# Patient Record
Sex: Male | Born: 1950 | Race: White | Hispanic: No | Smoking: Former smoker
Health system: Southern US, Community
[De-identification: ages and names within clinical notes are randomized; demographics above are authoritative.]

## PROBLEM LIST (undated history)

## (undated) DIAGNOSIS — R600 Localized edema: Secondary | ICD-10-CM

## (undated) DIAGNOSIS — F0390 Unspecified dementia without behavioral disturbance: Secondary | ICD-10-CM

## (undated) DIAGNOSIS — N289 Disorder of kidney and ureter, unspecified: Secondary | ICD-10-CM

## (undated) DIAGNOSIS — I252 Old myocardial infarction: Secondary | ICD-10-CM

## (undated) DIAGNOSIS — I10 Essential (primary) hypertension: Secondary | ICD-10-CM

## (undated) DIAGNOSIS — E119 Type 2 diabetes mellitus without complications: Secondary | ICD-10-CM

## (undated) DIAGNOSIS — N050 Unspecified nephritic syndrome with minor glomerular abnormality: Secondary | ICD-10-CM

## (undated) DIAGNOSIS — G4730 Sleep apnea, unspecified: Secondary | ICD-10-CM

## (undated) DIAGNOSIS — E7800 Pure hypercholesterolemia, unspecified: Secondary | ICD-10-CM

## (undated) HISTORY — DX: Disorder of kidney and ureter, unspecified: N28.9

## (undated) HISTORY — DX: Type 2 diabetes mellitus without complications: E11.9

## (undated) HISTORY — DX: Localized edema: R60.0

## (undated) HISTORY — PX: CHOLECYSTECTOMY: SHX55

## (undated) HISTORY — DX: Pure hypercholesterolemia, unspecified: E78.00

## (undated) HISTORY — DX: Unspecified nephritic syndrome with minor glomerular abnormality: N05.0

## (undated) HISTORY — DX: Old myocardial infarction: I25.2

## (undated) HISTORY — PX: APPENDECTOMY: SHX54

## (undated) HISTORY — DX: Essential (primary) hypertension: I10

---

## 2017-10-22 DIAGNOSIS — I2102 ST elevation (STEMI) myocardial infarction involving left anterior descending coronary artery: Secondary | ICD-10-CM

## 2017-10-22 DIAGNOSIS — I2510 Atherosclerotic heart disease of native coronary artery without angina pectoris: Secondary | ICD-10-CM

## 2017-10-22 HISTORY — DX: ST elevation (STEMI) myocardial infarction involving left anterior descending coronary artery: I21.02

## 2017-10-22 HISTORY — DX: Atherosclerotic heart disease of native coronary artery without angina pectoris: I25.10

## 2020-04-22 DIAGNOSIS — E785 Hyperlipidemia, unspecified: Secondary | ICD-10-CM | POA: Insufficient documentation

## 2020-05-24 ENCOUNTER — Ambulatory Visit: Payer: Self-pay | Admitting: Internal Medicine

## 2020-06-14 ENCOUNTER — Encounter: Payer: Self-pay | Admitting: Internal Medicine

## 2020-06-14 ENCOUNTER — Ambulatory Visit (INDEPENDENT_AMBULATORY_CARE_PROVIDER_SITE_OTHER): Payer: Medicare Other | Admitting: Internal Medicine

## 2020-06-14 ENCOUNTER — Other Ambulatory Visit: Payer: Self-pay

## 2020-06-14 VITALS — BP 98/52 | HR 87 | Ht 71.5 in | Wt 216.6 lb

## 2020-06-14 DIAGNOSIS — I25810 Atherosclerosis of coronary artery bypass graft(s) without angina pectoris: Secondary | ICD-10-CM

## 2020-06-14 DIAGNOSIS — E782 Mixed hyperlipidemia: Secondary | ICD-10-CM

## 2020-06-14 DIAGNOSIS — N189 Chronic kidney disease, unspecified: Secondary | ICD-10-CM

## 2020-06-14 DIAGNOSIS — I10 Essential (primary) hypertension: Secondary | ICD-10-CM

## 2020-06-14 LAB — CBC
Hematocrit: 39.5 % (ref 37.5–51.0)
Hemoglobin: 13.7 g/dL (ref 13.0–17.7)
MCH: 31 pg (ref 26.6–33.0)
MCHC: 34.7 g/dL (ref 31.5–35.7)
MCV: 89 fL (ref 79–97)
Platelets: 219 10*3/uL (ref 150–450)
RBC: 4.42 x10E6/uL (ref 4.14–5.80)
RDW: 13.2 % (ref 11.6–15.4)
WBC: 9.7 10*3/uL (ref 3.4–10.8)

## 2020-06-14 LAB — BASIC METABOLIC PANEL
BUN/Creatinine Ratio: 7 — ABNORMAL LOW (ref 10–24)
BUN: 16 mg/dL (ref 8–27)
CO2: 26 mmol/L (ref 20–29)
Calcium: 9 mg/dL (ref 8.6–10.2)
Chloride: 103 mmol/L (ref 96–106)
Creatinine, Ser: 2.19 mg/dL — ABNORMAL HIGH (ref 0.76–1.27)
GFR calc Af Amer: 35 mL/min/{1.73_m2} — ABNORMAL LOW (ref >59)
GFR calc non Af Amer: 30 mL/min/{1.73_m2} — ABNORMAL LOW (ref >59)
Glucose: 179 mg/dL — ABNORMAL HIGH (ref 65–99)
Potassium: 4.6 mmol/L (ref 3.5–5.2)
Sodium: 136 mmol/L (ref 134–144)

## 2020-06-14 LAB — URINALYSIS
Bilirubin, UA: NEGATIVE
Ketones, UA: NEGATIVE
Leukocytes,UA: NEGATIVE
Nitrite, UA: NEGATIVE
Specific Gravity, UA: 1.021 (ref 1.005–1.030)
Urobilinogen, Ur: 1 mg/dL (ref 0.2–1.0)
pH, UA: 6.5 (ref 5.0–7.5)

## 2020-06-14 LAB — LIPID PANEL
Chol/HDL Ratio: 5.5 ratio — ABNORMAL HIGH (ref 0.0–5.0)
Cholesterol, Total: 232 mg/dL — ABNORMAL HIGH (ref 100–199)
HDL: 42 mg/dL (ref >39)
LDL Chol Calc (NIH): 141 mg/dL — ABNORMAL HIGH (ref 0–99)
Triglycerides: 270 mg/dL — ABNORMAL HIGH (ref 0–149)
VLDL Cholesterol Cal: 49 mg/dL — ABNORMAL HIGH (ref 5–40)

## 2020-06-14 NOTE — Patient Instructions (Signed)
Medication Instructions:  No changes *If you need a refill on your cardiac medications before your next appointment, please call your pharmacy*   Lab Work: Today: lipids, cbc, bmet, urinalysis   If you have labs (blood work) drawn today and your tests are completely normal, you will receive your results only by: Marland Kitchen MyChart Message (if you have MyChart) OR . A paper copy in the mail If you have any lab test that is abnormal or we need to change your treatment, we will call you to review the results.   Testing/Procedures: none   Follow-Up: At [facility], you and your health needs are our priority.  As part of our continuing mission to provide you with exceptional heart care, we have created designated Provider Care Teams.  These Care Teams include your primary Cardiologist (physician) and Advanced Practice Providers (APPs -  Physician Assistants and Nurse Practitioners) who all work together to provide you with the care you need, when you need it.  We recommend signing up for the patient portal called "MyChart".  Sign up information is provided on this After Visit Summary.  MyChart is used to connect with patients for Virtual Visits (Telemedicine).  Patients are able to view lab/test results, encounter notes, upcoming appointments, etc.  Non-urgent messages can be sent to your provider as well.   To learn more about what you can do with MyChart, go to [email or web address].    Your next appointment:   8 month(s) (August 2022)  The format for your next appointment:   In Person  Provider:   You may see Dr. Dietrich Pates or one of the following Advanced Practice Providers on your designated Care Team:    Tereso Newcomer, PA-C  Chelsea Aus, [place]    Other Instructions You have been referred to [facility]

## 2020-06-14 NOTE — Progress Notes (Signed)
Cardiology Office Note   Date:  06/14/2020   ID:  Anthony Morse, DOB 1951/01/16, MRN [id number]  PCP:  Anthony Brod, Morse  Cardiologist:   Anthony Pates, Morse   Pt referred for continued care of CAD    History of Present Illness: Anthony Morse is a 70 y.o. male with a history of coronary disease, diabetes, hyperlipidemia, hypertension, chronic kidney disease, GE reflux.  Patient is followed by Anthony Morse.  He was seen in December.  Referred to cardiology for further care. He was previously followed in [facility]  Cardiac Hx:   The pt  has a history of an MI i2007, s/p PTCA/Stent x 2.  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 Stress test in October 2019 showed no ischemia. Echo in 2019 LVEF 40%  He was followed in the [facility] health system.  Anthony Morse.  He was last seen in February 2021. Marland Kitchen  Last lipid panel back in January this year LDL was 120, HDL 31, triglycerides 365.  Hemoglobin A1c was 7.  The patient was seen by his primary care Anthony Morse back early part of January.  His blood pressure was low at the time and lisinopril and carvedilol were held.  He is also also off Lasix.  Since then he denies dizziness.  His blood pressure is still low.  Again the pt has moved to this area to be closer to family Since seen in clinic the patient denies chest pain.  Breathing is okay.  He says he is drinking adequately.  He would like to have a referral to [facility].   Current Meds  Medication Sig  . aspirin EC 81 MG tablet Take 81 mg by mouth daily. Swallow whole.  Marland Kitchen atorvastatin (LIPITOR) 40 MG tablet Take 40 mg by mouth daily.  . fenofibrate micronized (ANTARA) 130 MG capsule Take 130 mg by mouth daily before breakfast.  . gabapentin (NEURONTIN) 100 MG capsule Take 100 mg by mouth 3 (three) times daily.  . insulin aspart protamine- aspart (NOVOLOG MIX 70/30) (70-30) 100 UNIT/ML injection Inject into the skin.  Marland Kitchen niacin 500 MG tablet Take 500 mg by mouth at bedtime.     Allergies:   Tramadol    Past Medical History:  Diagnosis Date  . DM (diabetes mellitus) (HCC)   . Edema leg   . High cholesterol   . History of MI (myocardial infarction)   . Hypertension   . Kidney disease      Social History:  The patient  reports that he has quit smoking. He has never used smokeless tobacco.   Family History:  The patient's family history is not on file.    ROS:  Please see the history of present illness. All other systems are reviewed and  Negative to the above problem except as noted.    PHYSICAL EXAM: VS:  BP (!) 98/52   Pulse 87   Ht 5' 11.5" (1.816 m)   Wt 216 lb 9.6 oz (98.2 kg)   BMI 29.79 kg/m   GEN: Well nourished, well developed, in no acute distress  HEENT: normal  Neck: no JVD, carotid bruits, Cardiac: RRR; no murmurs.  No lower extremity edema  Respiratory:  clear to auscultation bilaterally, GI: soft, nontender, nondistended, + BS  No hepatomegaly  MS: no deformity Moving all extremities   Skin: warm and dry, no rash Neuro:  Strength and sensation are intact Psych: euthymic mood, full affect   EKG:  EKG is ordered today.  Sinus rhythm.  87 bpm.  Occasional PVC.  Right bundle branch block.   Nonspecific ST changes  Tests done at [facility]:  Echo (03/13/2018) Mildly abnormal left ventricular ejection fraction estimated at 40%. Abnormal relaxation filling pattern of the left ventricle for age (stage 1 diastolic dysfunction). The right ventricle is normal in size and function. The right atrium is normal in size. Mild to Moderate Left atrial dilatation. Mild aortic valve sclerosis.  Stress Cardiolite(02/19/2018):  A small perfusion abnormality of moderate severity in the apex region on the stress images. This defect is fixed and suggestive of a scar. No significant reversibility to suggest ischemia.    Lipid Panel No results found for: CHOL, TRIG, HDL, CHOLHDL, VLDL, LDLCALC, LDLDIRECT    Wt Readings from Last 3 Encounters:   06/14/20 216 lb 9.6 oz (98.2 kg)      ASSESSMENT AND PLAN:  1.  CAD.  Patient with a history of remote CABG.  Doing well clinically.  Volume appears OK   REcently he was taken off his medicines (carvedilol, lisinopril) because of low blood pressure.  We will check labs today.  We will also check a specific gravity to see him when she is concentrating urine.  He has a follow-up with Anthony Morse in the next few weeks who can titrate things back.  Again, as blood pressure tolerates.  I will set to see later this summer.  2 chronic CHF (systolic.  LVEF on echo was 40%.  Blood pressure is low.  I would hold other medicines.  Add back as blood pressure tolerates.  Overall volume appears okay.  I would not plan another echocardiogram at this point.  3.  Hyperlipidemia.  Last lipids from Dr. Maceo Morse office the LDL was 120.  This is very different than the [facility] labs with the LDL was 77.  We will repeat today  4.  Diabetes.  Watch carbohydrates.  Follow-up in August.  Again he will be seen by Anthony Morse again.  Also hopefully he will be seen in renal clinic.     Current medicines are reviewed at length with the patient today.  The patient does not have concerns regarding medicines.  Signed, Anthony Pates, Morse  06/14/2020 10:51 AM    [facility] [address] [place], [place], [place]  [address] Phone: [phone]; Fax: [phone]

## 2020-06-20 ENCOUNTER — Telehealth: Payer: Self-pay | Admitting: Internal Medicine

## 2020-06-20 DIAGNOSIS — E782 Mixed hyperlipidemia: Secondary | ICD-10-CM

## 2020-06-20 NOTE — Telephone Encounter (Signed)
Pt daughter called in returning Ann's call for results.  Best cb number is [phone]

## 2020-06-21 NOTE — Telephone Encounter (Signed)
Left message for patient's daughter to call back. (DPR)

## 2020-06-22 MED ORDER — ROSUVASTATIN CALCIUM 40 MG PO TABS: 40.0000 mg | ORAL_TABLET | Freq: Every day | ORAL | 3 refills | Status: AC

## 2020-06-22 NOTE — Telephone Encounter (Signed)
-----   Message from Pricilla Riffle, MD sent at 06/15/2020 12:54 PM EST ----- CBC is normal   Cri is a little over 2   Urine is concentrated    Just make sure getting adequate fluids during day   (low salt) LDL is higher than it shold be   Would he be willing to switch to 40 mg Crestor   More portent at lowering LDL  Check lipids in 2 months

## 2020-06-22 NOTE — Telephone Encounter (Signed)
Spoke with patient's daughter. Reviewed labs.  Pt drinking mostly coffee and diet sodas.  She will encourage him to add more water/calorie free drinks other than sodas daily, aiming for 50-60 ounces of water daily.  Pt was seen in renal clinic recently by Dr. Vivia Birmingham.  Will forward labs there and to PCP.  He will stop atorvastatin and begin Crestor and return for labs on 08/18/20.

## 2020-07-04 ENCOUNTER — Other Ambulatory Visit: Payer: Self-pay | Admitting: Nephrology

## 2020-07-04 DIAGNOSIS — N1832 Chronic kidney disease, stage 3b: Secondary | ICD-10-CM

## 2020-07-14 ENCOUNTER — Ambulatory Visit
Admission: RE | Admit: 2020-07-14 | Discharge: 2020-07-14 | Disposition: A | Discharge status: Home / Self Care | Payer: Medicare Other | Source: Ambulatory Visit | Attending: Nephrology | Admitting: Nephrology

## 2020-07-14 DIAGNOSIS — N1832 Chronic kidney disease, stage 3b: Secondary | ICD-10-CM

## 2020-08-15 ENCOUNTER — Other Ambulatory Visit (HOSPITAL_COMMUNITY): Payer: Self-pay | Admitting: Nephrology

## 2020-08-15 DIAGNOSIS — N1832 Chronic kidney disease, stage 3b: Secondary | ICD-10-CM

## 2020-08-18 ENCOUNTER — Other Ambulatory Visit: Payer: Medicare Other

## 2020-08-22 ENCOUNTER — Ambulatory Visit (HOSPITAL_COMMUNITY): Payer: Medicare Other

## 2020-08-22 ENCOUNTER — Other Ambulatory Visit: Payer: Self-pay | Admitting: Student

## 2020-08-22 ENCOUNTER — Other Ambulatory Visit: Payer: Self-pay | Admitting: Radiology

## 2020-08-23 ENCOUNTER — Other Ambulatory Visit: Payer: Self-pay

## 2020-08-23 ENCOUNTER — Ambulatory Visit (HOSPITAL_COMMUNITY)
Admission: RE | Admit: 2020-08-23 | Discharge: 2020-08-23 | Disposition: A | Discharge status: Home / Self Care | Payer: Medicare Other | Source: Ambulatory Visit | Attending: Nephrology | Admitting: Nephrology

## 2020-08-23 ENCOUNTER — Encounter (HOSPITAL_COMMUNITY): Payer: Self-pay

## 2020-08-23 DIAGNOSIS — N1832 Chronic kidney disease, stage 3b: Secondary | ICD-10-CM

## 2020-08-23 DIAGNOSIS — Z794 Long term (current) use of insulin: Secondary | ICD-10-CM | POA: Insufficient documentation

## 2020-08-23 DIAGNOSIS — I129 Hypertensive chronic kidney disease with stage 1 through stage 4 chronic kidney disease, or unspecified chronic kidney disease: Secondary | ICD-10-CM | POA: Diagnosis present

## 2020-08-23 DIAGNOSIS — R809 Proteinuria, unspecified: Secondary | ICD-10-CM | POA: Diagnosis not present

## 2020-08-23 DIAGNOSIS — N189 Chronic kidney disease, unspecified: Secondary | ICD-10-CM | POA: Insufficient documentation

## 2020-08-23 DIAGNOSIS — E1122 Type 2 diabetes mellitus with diabetic chronic kidney disease: Secondary | ICD-10-CM | POA: Diagnosis not present

## 2020-08-23 LAB — GLUCOSE, CAPILLARY: Glucose-Capillary: 157 mg/dL — ABNORMAL HIGH (ref 70–99)

## 2020-08-23 LAB — CBC
HCT: 41.7 % (ref 39.0–52.0)
Hemoglobin: 14.2 g/dL (ref 13.0–17.0)
MCH: 30.9 pg (ref 26.0–34.0)
MCHC: 34.1 g/dL (ref 30.0–36.0)
MCV: 90.7 fL (ref 80.0–100.0)
Platelets: 304 10*3/uL (ref 150–400)
RBC: 4.6 MIL/uL (ref 4.22–5.81)
RDW: 13.1 % (ref 11.5–15.5)
WBC: 9.9 10*3/uL (ref 4.0–10.5)
nRBC: 0 % (ref 0.0–0.2)

## 2020-08-23 LAB — PROTIME-INR
INR: 1 (ref 0.8–1.2)
Prothrombin Time: 12.9 seconds (ref 11.4–15.2)

## 2020-08-23 MED ORDER — SODIUM CHLORIDE 0.9 % IV SOLN: INTRAVENOUS | Status: DC

## 2020-08-23 MED ORDER — FENTANYL CITRATE (PF) 100 MCG/2ML IJ SOLN
INTRAMUSCULAR | Status: AC | PRN
  Administered 2020-08-23 (×2): 25 ug via INTRAVENOUS

## 2020-08-23 MED ORDER — LIDOCAINE HCL (PF) 1 % IJ SOLN
INTRAMUSCULAR | Status: AC
  Filled 2020-08-23: qty 30

## 2020-08-23 MED ORDER — MIDAZOLAM HCL 2 MG/2ML IJ SOLN
INTRAMUSCULAR | Status: AC | PRN
  Administered 2020-08-23: 1 mg via INTRAVENOUS

## 2020-08-23 MED ORDER — MIDAZOLAM HCL 2 MG/2ML IJ SOLN
INTRAMUSCULAR | Status: AC
  Filled 2020-08-23: qty 2

## 2020-08-23 MED ORDER — SODIUM CHLORIDE 0.9 % IV SOLN
INTRAVENOUS | Status: AC | PRN
  Administered 2020-08-23: 10 mL/h via INTRAVENOUS

## 2020-08-23 MED ORDER — GELATIN ABSORBABLE 12-7 MM EX MISC
CUTANEOUS | Status: AC
  Filled 2020-08-23: qty 1

## 2020-08-23 MED ORDER — FENTANYL CITRATE (PF) 100 MCG/2ML IJ SOLN
INTRAMUSCULAR | Status: AC
  Filled 2020-08-23: qty 2

## 2020-08-23 NOTE — Progress Notes (Signed)
Discharge instructions reviewed with pt and his daughter ( via telephone) both voice understanding.  

## 2020-08-23 NOTE — Procedures (Signed)
Interventional Radiology Procedure Note  Procedure: US guided biopsy of left kidney, medical renal Complications: None EBL: None Recommendations: - Bedrest 2 hours.   - Routine wound care - Follow up pathology - Advance diet   Signed,  Andrianna Manalang, DO   

## 2020-08-23 NOTE — H&P (Signed)
Chief Complaint: Evaluation of CKD due to proteinuria and hematuria. Team is requesting a random kidney biopsy.  Referring Physician(s): Upton,Elizabeth  Supervising Physician: Gilmer Mor  Patient Status: [facility] - Out-pt  History of Present Illness: Anthony Morse is a 70 y.o. male 70 y.o. male outpatient. History of HTN, sleep apnea on CPAP, bilateral lower extremity edema, DM, CAD s/p CABG, CKD, meniere's disease. Patient is being followed by nephrology for CKD. Found to have protein and hematuria in UA obtained at the nephrologist office. [place] right renal from 2.24.22 reads - No acute finding. Mildly increased renal parenchymal echogenicity as can be seen in medical renal disease. Team is requesting a kidney biopsy for further evaluation of CKD.  Currently without any significant complaints. Patient alert and laying in bed, calm and comfortable. Denies any fevers, headache, chest pain, SOB, cough, nausea, vomiting or bleeding. Endorses suprapubic pain that is intermittent in nature and persistent abdominal pain at the 4 surgical sites from his laparoscopic appendectomy from 12.12.19. Return precautions and treatment recommendations and follow-up discussed with the patient who is agreeable with the plan.     Past Medical History:  Diagnosis Date  . DM (diabetes mellitus) (HCC)   . Edema leg   . High cholesterol   . History of MI (myocardial infarction)   . Hypertension   . Kidney disease     History reviewed. No pertinent surgical history.  Allergies: Tramadol  Medications: Prior to Admission medications   Medication Sig Start Date End Date Taking? Authorizing Provider  aspirin EC 81 MG tablet Take 81 mg by mouth daily. Swallow whole.   Yes [provider]  calcium carbonate (TUMS - DOSED IN MG ELEMENTAL CALCIUM) 500 MG chewable tablet Chew 500 mg by mouth daily as needed for indigestion or heartburn.   Yes [provider]  fenofibrate micronized (ANTARA)  130 MG capsule Take 130 mg by mouth daily before breakfast.   Yes [provider]  furosemide (LASIX) 80 MG tablet Take 80 mg by mouth daily.   Yes [provider]  gabapentin (NEURONTIN) 100 MG capsule Take 100 mg by mouth 2 (two) times daily.   Yes [provider]  insulin aspart protamine- aspart (NOVOLOG MIX 70/30) (70-30) 100 UNIT/ML injection Inject 10-20 Units into the skin 2 (two) times daily as needed (blood sugar over 150).   Yes [provider]  midodrine (PROAMATINE) 5 MG tablet Take 5 mg by mouth 2 (two) times daily.   Yes [provider]  niacin 500 MG tablet Take 500 mg by mouth at bedtime.   Yes [provider]  rosuvastatin (CRESTOR) 40 MG tablet Take 1 tablet (40 mg total) by mouth daily. 06/22/20  Yes Pricilla Riffle, MD     History reviewed. No pertinent family history.  Social History   Socioeconomic History  . Marital status: Divorced    Spouse name: Not on file  . Number of children: Not on file  . Years of education: Not on file  . Highest education level: Not on file  Occupational History  . Not on file  Tobacco Use  . Smoking status: Former Games developer  . Smokeless tobacco: Never Used  Vaping Use  . Vaping Use: Never used  Substance and Sexual Activity  . Alcohol use: Not Currently  . Drug use: Not Currently  . Sexual activity: Not on file  Other Topics Concern  . Not on file  Social History Narrative  . Not on file  Social Determinants of Health   Financial Resource Strain: Not on file  Food Insecurity: Not on file  Transportation Needs: Not on file  Physical Activity: Not on file  Stress: Not on file  Social Connections: Not on file    Review of Systems: A 12 point ROS discussed and pertinent positives are indicated in the HPI above.  All other systems are negative.  Review of Systems  Constitutional: Negative for fever.  HENT: Negative for congestion.   Respiratory: Negative for cough and  shortness of breath.   Cardiovascular: Negative for chest pain.  Gastrointestinal: Negative for abdominal pain ( intermitten supra pubic pain and persistent pain at the 4 surgical sites from his lap appy in 2019).  Neurological: Negative for headaches.  Psychiatric/Behavioral: Negative for behavioral problems and confusion.    Vital Signs: BP 103/79   Pulse 91   Temp 98.2 F (36.8 C) (Oral)   Resp 16   Ht 5\' 11"  (1.803 m)   Wt 230 lb (104.3 kg)   SpO2 98%   BMI 32.08 kg/m   Physical Exam Vitals and nursing note reviewed.  Constitutional:      Appearance: He is well-developed.  HENT:     Head: Normocephalic.  Cardiovascular:     Rate and Rhythm: Normal rate and regular rhythm.     Heart sounds: Normal heart sounds.  Pulmonary:     Effort: Pulmonary effort is normal.     Breath sounds: Normal breath sounds.  Musculoskeletal:        General: Normal range of motion.     Cervical back: Normal range of motion.  Skin:    General: Skin is dry.  Neurological:     Mental Status: He is alert and oriented to person, place, and time.     Imaging: No results found.  Labs:  CBC: Recent Labs    06/14/20 1119 08/23/20 0635  WBC 9.7 9.9  HGB 13.7 14.2  HCT 39.5 41.7  PLT 219 304    COAGS: No results for input(s): INR, APTT in the last 8760 hours.  BMP: Recent Labs    06/14/20 1119  NA 136  K 4.6  CL 103  CO2 26  GLUCOSE 179*  BUN 16  CALCIUM 9.0  CREATININE 2.19*  GFRNONAA 30*  GFRAA 35*    LIVER FUNCTION TESTS: No results for input(s): BILITOT, AST, ALT, ALKPHOS, PROT, ALBUMIN in the last 8760 hours.  Assessment and Plan:  70 y.o. male outpatient. History of HTN, sleep apnea on CPAP, bilateral lower extremity edema, DM, CAD s/p CABG, CKD, meniere's disease. Patient is being followed by nephrology for CKD. Found to have protein and hematuria in UA obtained at the nephrologist office. 79 right renal from 2.24.22 reads - No acute finding. Mildly increased  renal parenchymal echogenicity as can be seen in medical renal disease. Team is requesting a kidney biopsy for further evaluation of CKD.  All labs and medications are within acceptable parameters. Allergies include tramadol/ Patient has been NPO since midnight.  Risks and benefits of kidney biopsy was discussed with the patient and/or patient's family including, but not limited to bleeding, infection, damage to adjacent structures or low yield requiring additional tests.  All of the questions were answered and there is agreement to proceed.  Consent signed and in chart.   Thank you for this interesting consult.  I greatly enjoyed meeting Anthony Morse and look forward to participating in their care.  A copy of this report was sent  to the requesting provider on this date.  Electronically Signed: Alene Mires, NP 08/23/2020, 7:11 AM   I spent a total of  30 Minutes   in face to face in clinical consultation, greater than 50% of which was counseling/coordinating care for kidney biopsy

## 2020-08-23 NOTE — Discharge Instructions (Addendum)
Percutaneous Kidney Biopsy, Care After This sheet gives you information about how to care for yourself after your procedure. Your health care provider may also give you more specific instructions. If you have problems or questions, contact your health care provider. What can I expect after the procedure? After the procedure, it is common to have:  Pain or soreness near the biopsy site.  Pink or cloudy urine for 24 hours after the procedure. This is normal. Follow these instructions at home: Activity  Return to your normal activities as told by your health care provider. Ask your health care provider what activities are safe for you.  If you were given a sedative during the procedure, it can affect you for several hours. Do not drive or operate machinery until your health care provider says that it is safe.  Do not lift anything that is heavier than 10 lb (4.5 kg), or the limit that you are told, until your health care provider says that it is safe.  Avoid activities that take a lot of effort until your health care provider approves. Most people will have to wait 2 weeks before returning to activities such as exercise or sex. General instructions  Take over-the-counter and prescription medicines only as told by your health care provider.  Follow instructions from your health care provider about eating or drinking restrictions.  Check your biopsy site every day for signs of infection. Check for: ? More redness, swelling, or pain. ? Fluid or blood. ? Warmth. ? Pus or a bad smell.  Keep all follow-up visits as told by your health care provider. This is important.   Contact a health care provider if:  You have more redness, swelling, or pain around your biopsy site.  You have fluid or blood coming from your biopsy site.  Your biopsy site feels warm to the touch.  You have pus or a bad smell coming from your biopsy site.  You have blood in your urine more than 24 hours after your  procedure. Get help right away if:  Your urine is dark red or brown.  You have a fever.  You are not able to urinate.  You feel burning when you urinate.  You feel dizzy or light-headed.  You have severe pain in your abdomen or side. Summary  After the procedure, it is common to have pain or soreness at the biopsy site and pink or cloudy urine for the first 24 hours.  Check your biopsy site each day for signs of infection, such as more redness, swelling, or pain; fluid, blood, pus or a bad smell coming from the biopsy site; or the biopsy site feeling warm to the touch.  Return to your normal activities as told by your health care provider. This information is not intended to replace advice given to you by your health care provider. Make sure you discuss any questions you have with your health care provider. Document Revised: 07/31/2019 Document Reviewed: 07/31/2019 Elsevier Patient Education  2021 Elsevier Inc. Moderate Conscious Sedation, Adult Sedation is the use of medicines to promote relaxation and to relieve discomfort and anxiety. Moderate conscious sedation is a type of sedation. Under moderate conscious sedation, you are less alert than normal, but you are still able to respond to instructions, touch, or both. Moderate conscious sedation is used during short medical and dental procedures. It is milder than deep sedation, which is a type of sedation under which you cannot be easily woken up. It is also milder than   general anesthesia, which is the use of medicines to make you unconscious. Moderate conscious sedation allows you to return to your regular activities sooner. Tell a health care provider about:  Any allergies you have.  All medicines you are taking, including vitamins, herbs, eye drops, creams, and over-the-counter medicines.  Any use of steroids. This includes steroids taken by mouth or as a cream.  Any problems you or family members have had with sedatives and  anesthetic medicines.  Any blood disorders you have.  Any surgeries you have had.  Any medical conditions you have, such as sleep apnea.  Whether you are pregnant or may be pregnant.  Any use of cigarettes, alcohol, marijuana, or drugs. What are the risks? Generally, this is a safe procedure. However, problems may occur, including:  Getting too much medicine (oversedation).  Nausea.  Allergic reaction to medicines.  Trouble breathing. If this happens, a breathing tube may be used. It will be removed when you are awake and breathing on your own.  Heart trouble.  Lung trouble.  Confusion that gets better with time (emergence delirium). What happens before the procedure? Staying hydrated Follow instructions from your health care provider about hydration, which may include:  Up to 2 hours before the procedure - you may continue to drink clear liquids, such as water, clear fruit juice, black coffee, and plain tea. Eating and drinking restrictions Follow instructions from your health care provider about eating and drinking, which may include:  8 hours before the procedure - stop eating heavy meals or foods, such as meat, fried foods, or fatty foods.  6 hours before the procedure - stop eating light meals or foods, such as toast or cereal.  6 hours before the procedure - stop drinking milk or drinks that contain milk.  2 hours before the procedure - stop drinking clear liquids. Medicines Ask your health care provider about:  Changing or stopping your regular medicines. This is especially important if you are taking diabetes medicines or blood thinners.  Taking medicines such as aspirin and ibuprofen. These medicines can thin your blood. Do not take these medicines unless your health care provider tells you to take them.  Taking over-the-counter medicines, vitamins, herbs, and supplements. Tests and exams  You will have a physical exam.  You may have blood tests done to  show how well: ? Your kidneys and liver work. ? Your blood clots. General instructions  Plan to have a responsible adult take you home from the hospital or clinic.  If you will be going home right after the procedure, plan to have a responsible adult care for you for the time you are told. This is important. What happens during the procedure?  You will be given the sedative. The sedative may be given: ? As a pill that you will swallow. It can also be inserted into the rectum. ? As a spray through the nose. ? As an injection into the muscle. ? As an injection into the vein through an IV.  You may be given oxygen as needed.  Your breathing, heart rate, and blood pressure will be monitored during the procedure.  The medical or dental procedure will be done. The procedure may vary among health care providers and hospitals.   What happens after the procedure?  Your blood pressure, heart rate, breathing rate, and blood oxygen level will be monitored until you leave the hospital or clinic.  You will get fluids through your IV if needed.  Do not drive   or operate machinery until your health care provider says that it is safe. Summary  Sedation is the use of medicines to promote relaxation and to relieve discomfort and anxiety. Moderate conscious sedation is a type of sedation that is used during short medical and dental procedures.  Tell the health care provider about any medical conditions that you have and about all the medicines that you are taking.  You will be given the sedative as a pill, a spray through the nose, an injection into the muscle, or an injection into the vein through an IV. Vital signs are monitored during the sedation.  Moderate conscious sedation allows you to return to your regular activities sooner. This information is not intended to replace advice given to you by your health care provider. Make sure you discuss any questions you have with your health care  provider. Document Revised: 09/04/2019 Document Reviewed: 04/02/2019 Elsevier Patient Education  2021 Elsevier Inc.  

## 2020-08-30 LAB — SURGICAL PATHOLOGY

## 2020-08-31 ENCOUNTER — Other Ambulatory Visit: Payer: Self-pay

## 2020-08-31 ENCOUNTER — Other Ambulatory Visit: Payer: Medicare Other | Admitting: *Deleted

## 2020-08-31 DIAGNOSIS — E782 Mixed hyperlipidemia: Secondary | ICD-10-CM

## 2020-08-31 LAB — LIPID PANEL
Chol/HDL Ratio: 6.5 ratio — ABNORMAL HIGH (ref 0.0–5.0)
Cholesterol, Total: 253 mg/dL — ABNORMAL HIGH (ref 100–199)
HDL: 39 mg/dL — ABNORMAL LOW (ref >39)
LDL Chol Calc (NIH): 158 mg/dL — ABNORMAL HIGH (ref 0–99)
Triglycerides: 300 mg/dL — ABNORMAL HIGH (ref 0–149)
VLDL Cholesterol Cal: 56 mg/dL — ABNORMAL HIGH (ref 5–40)

## 2020-09-02 ENCOUNTER — Telehealth: Payer: Self-pay | Admitting: *Deleted

## 2020-09-02 DIAGNOSIS — E782 Mixed hyperlipidemia: Secondary | ICD-10-CM

## 2020-09-02 DIAGNOSIS — I25810 Atherosclerosis of coronary artery bypass graft(s) without angina pectoris: Secondary | ICD-10-CM

## 2020-09-02 NOTE — Telephone Encounter (Signed)
-----   Message from Pricilla Riffle, MD sent at 09/01/2020 11:08 AM EDT ----- Lipid panel:   LDL is 158, higher than previous   ? Is he taking Crestor HIs triglycerides are high   Needs to cut back on sugar containing foods    Read lables carefully  If taking Crestor 40 mg daily then need to refer to lipid clinic for Repatha

## 2020-09-02 NOTE — Telephone Encounter (Signed)
Reviewed with patient's daughter. He takes crestor 40 mg daily.  Reviewed triglycerides and diet changes.  Appointment in lipid clinic scheduled.

## 2020-09-05 ENCOUNTER — Other Ambulatory Visit: Payer: Self-pay | Admitting: Nephrology

## 2020-09-05 DIAGNOSIS — N050 Unspecified nephritic syndrome with minor glomerular abnormality: Secondary | ICD-10-CM

## 2020-09-05 DIAGNOSIS — N1832 Chronic kidney disease, stage 3b: Secondary | ICD-10-CM

## 2020-09-06 ENCOUNTER — Encounter (HOSPITAL_COMMUNITY): Payer: Self-pay

## 2020-09-26 ENCOUNTER — Other Ambulatory Visit: Payer: Medicare Other

## 2020-09-27 ENCOUNTER — Ambulatory Visit: Payer: Medicare Other

## 2020-09-29 ENCOUNTER — Other Ambulatory Visit: Payer: Medicare Other

## 2020-10-03 ENCOUNTER — Ambulatory Visit
Admission: RE | Admit: 2020-10-03 | Discharge: 2020-10-03 | Disposition: A | Discharge status: Home / Self Care | Payer: Medicare Other | Source: Ambulatory Visit | Attending: Nephrology | Admitting: Nephrology

## 2020-10-03 ENCOUNTER — Other Ambulatory Visit: Payer: Self-pay

## 2020-10-03 DIAGNOSIS — N050 Unspecified nephritic syndrome with minor glomerular abnormality: Secondary | ICD-10-CM

## 2020-10-03 DIAGNOSIS — N1832 Chronic kidney disease, stage 3b: Secondary | ICD-10-CM

## 2020-10-11 ENCOUNTER — Other Ambulatory Visit: Payer: Self-pay | Admitting: Nephrology

## 2020-10-11 DIAGNOSIS — N1832 Chronic kidney disease, stage 3b: Secondary | ICD-10-CM

## 2020-10-14 ENCOUNTER — Ambulatory Visit (INDEPENDENT_AMBULATORY_CARE_PROVIDER_SITE_OTHER): Payer: Medicare Other | Admitting: Pharmacist

## 2020-10-14 ENCOUNTER — Other Ambulatory Visit: Payer: Self-pay

## 2020-10-14 DIAGNOSIS — I25810 Atherosclerosis of coronary artery bypass graft(s) without angina pectoris: Secondary | ICD-10-CM | POA: Diagnosis not present

## 2020-10-14 DIAGNOSIS — E785 Hyperlipidemia, unspecified: Secondary | ICD-10-CM | POA: Diagnosis not present

## 2020-10-14 NOTE — Progress Notes (Signed)
Patient ID: Anthony Morse                 DOB: [birth year]                    MRN: [id number]     HPI: Anthony Morse is a 70 y.o. male patient referred to lipid clinic by Dr. Tenny Craw. PMH is significant for  coronary disease, diabetes (MI in 2007, CABG in 2012), hyperlipidemia, hypertension, chronic kidney disease, GE reflux.  Patient presents to the lipid clinic alone. He lives in a independent living facility. His daughter, [name], does his medications. He thinks he is taking rosuvastatin, niacin and fenofibrate. He wonders if his lipids are worse because he has been on steroids for "weeping legs." However it looks like he started taking the steroids the day after his lipid panel.   Current Medications: rosuvastatin 40mg  daily, niacin 500mg  at bedtime, fenofibrate 130mg  daily Intolerances:  Risk Factors: progressive ASCVD, DM, CKD LDL goal: <55  Diet: breakfast: gritts, toast, bacon Lunch: pot roast, mashed potatoes, corn - sandwich Dinner: '[place] country food" Lives in a senior center and they prepare food Drinks: water, diet soda Ice cream once a week  Exercise: not much since he retired  Family History: none on file  Social History: The patient  reports that he has quit smoking. He has never used smokeless tobacco.   Labs:08/31/20 TC 253, TG 300, HDL 39, LDL 158 (rosuvastatin 40mg  daily, niacin 500mg  at bedtime, fenofibrate 130mg  daily)   Past Medical History:  Diagnosis Date  . DM (diabetes mellitus) (HCC)   . Edema leg   . High cholesterol   . History of MI (myocardial infarction)   . Hypertension   . Kidney disease     Current Outpatient Medications on File Prior to Visit  Medication Sig Dispense Refill  . aspirin EC 81 MG tablet Take 81 mg by mouth daily. Swallow whole.    . calcium carbonate (TUMS - DOSED IN MG ELEMENTAL CALCIUM) 500 MG chewable tablet Chew 500 mg by mouth daily as needed for indigestion or heartburn.    . fenofibrate micronized (ANTARA) 130 MG capsule Take  130 mg by mouth daily before breakfast.    . furosemide (LASIX) 80 MG tablet Take 80 mg by mouth daily.    gabapentin (NEURONTIN) 100 MG capsule Take 100 mg by mouth 2 (two) times daily.    . insulin aspart protamine- aspart (NOVOLOG MIX 70/30) (70-30) 100 UNIT/ML injection Inject 10-20 Units into the skin 2 (two) times daily as needed (blood sugar over 150).    . midodrine (PROAMATINE) 5 MG tablet Take 5 mg by mouth 2 (two) times daily.    . niacin 500 MG tablet Take 500 mg by mouth at bedtime.    . rosuvastatin (CRESTOR) 40 MG tablet Take 1 tablet (40 mg total) by mouth daily. 90 tablet 3   No current facility-administered medications on file prior to visit.    Allergies  Allergen Reactions  . Tramadol     Delusions (intolerance), Hallucinations,    Assessment/Plan:  1. Hyperlipidemia - LDL is above goal of <55. Patient is agreeable to start PCSK9i. I will call patients daughter to confirm he is taking rosuvastatin 40mg  daily, niacin 500mg  at bedtime and fenofibrate 130mg  daily. I have called and left a VM for her to call back. Unfortunately the [employer] fund grant has closed. I will need to speak with the daughter to see if he is able to  afford. When I asked if he could afford 126/90 days he said he does not know what he spends. I will try to reach out to the daughter again if I do not hear back. There is no phone # on file for the patient. If he cannot afford then will need to discuss trials or see if Wilber Bihari is affordable. We discussed diet and decreasing stachy foods and sugars. Will submit PA for Praluent. Continue rosuvastatin 40mg  daily, niacin 500mg  at bedtime, fenofibrate 130mg  daily.  Thank you,   , Pharm.D, BCPS, CPP [facility]  [address] [place], [place] [address]  Phone: [phone]; Fax: [phone]

## 2020-10-14 NOTE — Patient Instructions (Addendum)
Please try to decrease the amount of bread starchy foods (potatoes) and sweets you eat (including "sugar free drinks").  I will submit paperwork for approval for Praluent from your insurance. I will call you when it is approved.  Please call me with an questions [phone]   TIPS for Living a healthier life  SUGAR  Sugar is a huge problem in the modern day diet. Sugar is a HUGE contributor to heart disease, diabetes, high triglyceride levels, fatty liver diease and obesity. Sugar is hidden in almost all packaged foods/beverages. Added sugar is extra sugar that is added beyond what is naturally found. It adds no nutritional benefit to your body and can cause major harm. The American Heart Association recommends limiting added sugars to no more than 25g for women and 36 grams for men per day.  There are many names for sugar maltose, sucrose (names ending in "ose"), high fructose corn syrup, molasses, cane sugar, corn sweetener, raw sugar, syrup, honey or fruit juice concentrate.   One of the best ways to limit your added sugars is to stop drinking sweetened beverages such as soda, sweet tea, fruit juice or fancy coffee's. There is 65g of added sugars in one 20oz bottle of Coke!! That is equal to 6 donuts.   Pay attention and read all nutrition facts labels. Below is an examples of a nutrition facts label. The #1 is showing you the total sugars where the # 2 is showing you the added sugars. This one severing has almost the max amount of added sugars per day!  Watch out for items that say "low fat" or "no added sugar" as these products are typically very high in sugar. The food industry uses these terms to fool you into thinking they are healthy.  For more information on the dangers of sugar watch WHY Sugar is as Bad as Alcohol (Fructose, The Liver Toxin) on YouTube.    EXERCISE  Exercise is good. We've all heard that. In an ideal world, we would all have time and resources to  get plenty  of it. When you are active your heart pumps more efficiently and you will feel better.  Multiple studies show that even walking regularly has benefits that include living a longer life.  The American Heart Association recommends 90-150 minutes per week of exercise (30 minutes  per day most days of the week). You can do this in any increment you wish. Nine or more  10-minute walks count. So does an hour-long exercise class. Break the time apart into what will  work in your life. Some of the best things you can do include walking briskly, jogging, cycling or  swimming laps. Not everyone is ready to "exercise." Sometimes we need to start with just getting active. Here  are some easy ways to be more active throughout the day: Marland Kitchen Take the stairs instead of the elevator . Go for a 10-15 minute walk during your lunch break (find a friend to make it more enjoyable) . When shopping, park at the back of the parking lot . If you take public transportation, get off one stop early and walk the extra distance . Pace around while making phone calls (most of [place] are not attached to phone cords any longer!) Check with your doctor if you aren't sure what your limitations may be. Always remember to drink plenty of water when doing any type of exercise. Don't feel like a failure if you're not getting the 90-150 minutes per week. If  you started by being  a couch potato, then just a 10-minute walk each day is a huge improvement. Start with little  victories and work your way up.   Healthy Eating Tips  When looking to improve your eating habits, whether to lose weight, lower blood pressure or just be healthier, it helps to know what a serving size is.   Grains 1 slice of bread,  bagel,  cup pasta or rice  Vegetables 1 cup fresh or raw vegetables,  cup cooked or canned Fruits 1 piece of medium sized fruit,  cup canned,   Meats/Proteins  cup dried       1 oz meat, 1 egg,  cup cooked beans, nuts or  seeds  Dairy        Fats Individual yogurt container, 1 cup (8oz)    1 teaspoon margarine/butter or vegetable  milk or milk alternative, 1 slice of cheese          oil; 1 tablespoon mayonnaise or salad dressing                  Plan ahead: make a menu of the meals for a week then create a grocery list to go with  that menu. Consider meals that easily stretch into a night of leftovers, such as stews or  casseroles. Or consider making two of your favorite meal and put one in the freezer or fridge for  another night.  When you get home from the grocery store wash and prepare your vegetables and fruits.  Then when you need them they are ready to go.  Tips for going to the grocery store: . Buy store or generic brands . Check the weekly ad from your store on-line or in their in-store flyer . Look at the unit price on the shelf tag to compare/contrast the costs of different items . Buy fruits/vegetables in season . Carrots, bananas and apples are low-cost, naturally healthy items . If meats or frozen vegetables are on sale, buy some extras and put in your freezer . Limit buying prepared or "ready to eat" items, even if they are pre-made salads or fruit snacks . Do not shop when you're hungry . Foods at eye level tend to be more expensive. Look on the high and low shelves for deals. . Consider shopping at the farmer's market for fresh foods in season. . Choose canned tuna or salmon instead of fresh . Avoid the cookie and chip aisles (these are expensive, high in calories and low in  nutritional value). Shop on the outside of the grocery store.  Aim to have one 12 hour fast each day. This means no eating after dinner until breakfast. For example, if you eat dinner around 6 PM then you would not eat anything until 6 AM the next day. This is a great way to help lower your insulin levels, lose weight and reduce your blood pressure.   Healthy food preparations: . If you can't get lean hamburger,  be sure to drain the fat when cooking . Steam, saut (in olive oil), grill or bake foods . Experiment with different seasonings to avoid adding salt to your foods. Kosher salt, sea salt and Himalayan salt are all still salt and should be avoided Try seasoning food with onion, garlic, thyme, rosemary, basil ect. Onion powder or garlic powder is ok. Avoid if it says salt (ie garlic salt).        Resources: American Heart Association - MartiniMobile.it Go to the  Healthy Living tab to get more information American Diabetes Association - www.diabetes.org You don't have to be diabetic - check out the Food and Fitness tab

## 2020-10-19 ENCOUNTER — Telehealth: Payer: Self-pay | Admitting: Pharmacist

## 2020-10-19 DIAGNOSIS — E785 Hyperlipidemia, unspecified: Secondary | ICD-10-CM

## 2020-10-19 NOTE — Telephone Encounter (Signed)
Called pt daughter, [name] and left a VM for her to call back. Trying to clarify what cholesterol medications patient is taking. Also need to see if patient can afford 126/90 day supply for Praluent as Kennedy Bucker is no longer available.

## 2020-10-20 ENCOUNTER — Ambulatory Visit: Payer: Medicare Other | Admitting: Neurology

## 2020-10-20 ENCOUNTER — Encounter: Payer: Self-pay | Admitting: Neurology

## 2020-10-20 ENCOUNTER — Encounter: Payer: Self-pay | Admitting: *Deleted

## 2020-10-20 ENCOUNTER — Ambulatory Visit (INDEPENDENT_AMBULATORY_CARE_PROVIDER_SITE_OTHER): Payer: Medicare Other | Admitting: Neurology

## 2020-10-20 VITALS — BP 92/55 | HR 83 | Ht 71.5 in | Wt 209.0 lb

## 2020-10-20 DIAGNOSIS — I25810 Atherosclerosis of coronary artery bypass graft(s) without angina pectoris: Secondary | ICD-10-CM | POA: Diagnosis not present

## 2020-10-20 DIAGNOSIS — R410 Disorientation, unspecified: Secondary | ICD-10-CM | POA: Diagnosis not present

## 2020-10-20 DIAGNOSIS — R4189 Other symptoms and signs involving cognitive functions and awareness: Secondary | ICD-10-CM

## 2020-10-20 DIAGNOSIS — R413 Other amnesia: Secondary | ICD-10-CM | POA: Diagnosis not present

## 2020-10-20 DIAGNOSIS — R443 Hallucinations, unspecified: Secondary | ICD-10-CM

## 2020-10-20 NOTE — Progress Notes (Signed)
[facility]    Provider:  Dr Jaynee Eagles Requesting Provider: Clovia Cuff, MD Primary Care Provider:  Clovia Cuff, MD  CC:  Memory proble,s  HPI:  Anthony Morse is a 70 y.o. 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  I reviewed Dr. Altamese Dilling notes: Patient has a past medical history of Mnire's disease, hyperlipidemia, coronary artery disease status post CABG, chronic kidney disease stage IV, spinal stenosis, MI, antiplatelet drug use, hypertension, diabetic peripheral neuropathy, type 2 diabetes, hyperlipidemia, edema of lower extremity.  Patient has multiple medical conditions including bilateral leg swelling, negative for DVT, poor diet, uncontrolled hypertension, aspirin for history of MI, diabetes is controlled on current regimen, he follows with nephrologist for chronic kidney disease, recent memory normal and remote memory abnormal.  No further details provided by Dr. Daphene Jaeger on the chief complaint today.  I also  reviewed [place] [facility] notes Madelon Lips MD: Patient has sleep apnea in process of getting a CPAP, he previously smoked for 30 years.  Patient started noticing memory issues the last few months, he went to [place] an independent living home, he has been living there and they gave him some memory tests and he was surprised he didn't do so well. He is missing short-term memory. His daughter is here and says he is diabetic, he accidentally took his insulin twice, he overdosed on insulin, having difficulty with medicine administration. Daughter has set up his medication for him, he is still having difficulty. He is more confused. Some days he gets confused and thinks he may need to administer medicare to others. He is having waking hallucinations, he felt he was trapped in his bathroom and he thought the walls of his apartment were coming in. He has always worked and he retired around  Thanksgiving, he called his both and retired multiple times he kept forgetting. He moved to [place] [employer] from [place], he came to live close to his daughter and oldest son live in this area. He was a Chief Strategy Officer, he moved to be closer and that is when they started noticing worsening, he had a long term girlfriend he broke up with in 05/2019 and she saw some confusion at that time as well and they had already started seeing decline. He started having trouble sleeping and it progressed from there. He think there are a lot of bugs and are biting him all the time and has nightmares about bugs, getting days an nights mixed up. He is not driving, he was getting very lost and once they had to go get him, also difficulty navigating his apartment building. He will ask the same questions and having difficulty with days and nights, he has seen children that are not there, hallucinations are infrequent but he also saw a gaping hole in his floor, he is having a hard time scanning his glucose and using his phone, someone had gotten into his phone and changed all his contacts.   Reviewed notes, labs and imaging from outside physicians, which showed:  July 23, 2020 BUN 23 creatinine 2.19 abnormal EGFR, elevated white blood cells 11.3 otherwise CBC unremarkable,  Review of Systems: Patient complains of symptoms per HPI as well as the following symptoms: confusion. Pertinent negatives and positives per HPI. All others negative.   Social History   Socioeconomic History  . Marital status: Divorced    Spouse name: Not on file  . Number of children: Not on file  . Years of  education: Not on file  . Highest education level: Bachelor's degree (e.g., BA, AB, BS)  Occupational History  . Not on file  Tobacco Use  . Smoking status: Former Research scientist (life sciences)  . Smokeless tobacco: Never Used  Vaping Use  . Vaping Use: Never used  Substance and Sexual Activity  . Alcohol use: Not Currently  . Drug use: Not Currently  . Sexual  activity: Not on file  Other Topics Concern  . Not on file  Social History Narrative   Lives alone currently   Right handed   Caffeine: mostly decaf coffee but "quite a bit" per daughter plus coke zero. Pt states he drinks a lot of caffeine.    Social Determinants of Health   Financial Resource Strain: Not on file  Food Insecurity: Not on file  Transportation Needs: Not on file  Physical Activity: Not on file  Stress: Not on file  Social Connections: Not on file  Intimate Partner Violence: Not on file    Family History  Problem Relation Age of Onset  . Dementia Neg Hx     Past Medical History:  Diagnosis Date  . Dementia ([place])   . DM (diabetes mellitus) ([place])   . Edema leg   . High cholesterol   . History of MI (myocardial infarction)   . Hypertension   . Kidney disease   . 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 Sleep apnea     Patient Active Problem List   Diagnosis Date Noted  . Cognitive decline 10/25/2020  . Hyperlipidemia 04/22/2020  . Coronary artery disease 10/22/2017    Past Surgical History:  Procedure Laterality Date  . APPENDECTOMY    . CHOLECYSTECTOMY      Current Outpatient Medications  Medication Sig Dispense Refill  . aspirin EC 81 MG tablet Take 81 mg by mouth daily. Swallow whole.    . calcium carbonate (TUMS - DOSED IN MG ELEMENTAL CALCIUM) 500 MG chewable tablet Chew 500 mg by mouth daily as needed for indigestion or heartburn.    . Continuous Blood Gluc Sensor (FREESTYLE LIBRE 14 DAY SENSOR) MISC Apply topically as directed.    . fenofibrate micronized (ANTARA) 130 MG capsule Take 130 mg by mouth daily before breakfast.    . furosemide (LASIX) 40 MG tablet Take 40 mg by mouth.    . gabapentin (NEURONTIN) 100 MG capsule Take 100 mg by mouth 2 (two) times daily.    . insulin aspart protamine- aspart (NOVOLOG MIX 70/30) (70-30) 100 UNIT/ML injection Inject 40 Units into the skin 2 (two) times daily as  needed (blood sugar over 150).    . midodrine (PROAMATINE) 5 MG tablet Take 5 mg by mouth 2 (two) times daily.    . niacin 500 MG tablet Take 500 mg by mouth at bedtime.    . predniSONE (DELTASONE) 10 MG tablet 30 tablets.    . rosuvastatin (CRESTOR) 40 MG tablet Take 1 tablet (40 mg total) by mouth daily. 90 tablet 3  . Alirocumab (PRALUENT) 75 MG/ML SOAJ Inject 1 pen into the skin every 14 (fourteen) days. 6 mL 3  . amoxicillin-clavulanate (AUGMENTIN) 875-125 MG tablet Take 1 tablet by mouth every 12 (twelve) hours. 14 tablet 0  . losartan (COZAAR) 25 MG tablet Take 25 mg by mouth daily.    Marland Kitchen omeprazole (PRILOSEC) 20 MG capsule Take 1 capsule by mouth daily.     No current facility-administered medications for this visit.  Allergies as of 10/20/2020 - Review Complete 10/20/2020  Allergen Reaction Noted  . Tramadol  01/25/2017    Vitals: BP (!) 92/55 (BP Location: Right Arm, Patient Position: Sitting)   Pulse 83   Ht 5' 11.5" (1.816 m)   Wt 209 lb (94.8 kg)   SpO2 96%   BMI 28.74 kg/m  Last Weight:  Wt Readings from Last 1 Encounters:  10/23/20 209 lb (94.8 kg)   Last Height:   Ht Readings from Last 1 Encounters:  10/23/20 5' 11.5" (1.816 m)     Physical exam: Exam: Gen: NAD, conversant, well nourised, obese, well groomed                     CV: RRR, no MRG. No Carotid Bruits. No peripheral edema, warm, nontender Eyes: Conjunctivae clear without exudates or hemorrhage  Neuro: Detailed Neurologic Exam  Speech:    Speech is normal; fluent  Cognition: MMSE - Mini Mental State Exam 10/20/2020  Orientation to time 2  Orientation to Place 4  Registration 3  Attention/ Calculation 2  Recall 2  Language- name 2 objects 2  Language- repeat 1  Language- follow 3 step command 3  Language- read & follow direction 1  Write a sentence 0  Write a sentence-comments "go to the door"- no subject  Copy design 1  Total score 21    Cranial Nerves:    The pupils are equal,  round, and reactive to light. The fundi are flat. Visual fields are full to finger confrontation. Extraocular movements are intact. Trigeminal sensation is intact and the muscles of mastication are normal. The face is symmetric. The palate elevates in the midline. Hearing intact. Voice is normal. Shoulder shrug is normal. The tongue has normal motion without fasciculations.   Coordination:    Normal finger to nose   Gait:    Heel-toe are normal. Decreased arm swing, slightly narrowed gait.   Motor Observation:    No asymmetry, no atrophy, and no involuntary movements noted. Tone:    Normal muscle tone.    Posture:    Posture is normal. normal erect    Strength:    Strength is V/V in the upper and lower limbs.      Sensation: intact to LT     Reflex Exam:  DTR's:    Deep tendon reflexes in the upper and lower extremities are brisk bilaterally.   Toes:    The toes are equiv bilaterally.   Clonus:    Clonus is absent.    Assessment/Plan:  Appears to be having hallucinations/delusions, confusion on directions, taking his medications wrong he has overdosed on his insulin, has gotten lost driving and in his apartment building, getting his days and night mixed up, no personality or behavior changes, he has some loss of self awareness such as with showering, cognitive fluctuations. Suspicion is for Vascular Dementia or Lewy Body Dementia or possibly mixed.   Orders Placed This Encounter  Procedures  . MR BRAIN WO CONTRAST  . B12 and Folate Panel  . Methylmalonic acid, serum  . Vitamin B1  . Homocysteine  . TSH  . RPR  . Ammonia  . Ambulatory referral to Neuropsychology   No orders of the defined types were placed in this encounter.   Cc: Clovia Cuff, MD,  Clovia Cuff, MD  Sarina Ill, MD  [facility] [address] [place], [place] [address]  Phone [phone] Fax [phone]  I spent over 60 minutes  of face-to-face and  non-face-to-face time with patient on the  1. Cognitive decline   2. Confusion   3. Hallucinations   4. Memory loss    diagnosis.  This included previsit chart review, lab review, study review, order entry, electronic health record documentation, patient education on the different diagnostic and therapeutic options, counseling and coordination of care, risks and benefits of management, compliance, or risk factor reduction

## 2020-10-20 NOTE — Patient Instructions (Addendum)
Blood work MRI of the brain Formal memory testing   Vascular Dementia Dementia is a condition in which a person has problems with thinking, memory, and behavior that are severe enough to interfere with daily life. Vascular dementia is a type of dementia. It results from brain damage that is caused by the brain not getting enough blood. This condition may also be called vascular cognitive impairment. What are the causes? Vascular dementia is caused by conditions that reduce blood flow to the brain. Common causes of this condition include:  Multiple small strokes. These may happen without symptoms (silent stroke).  Major stroke.  Damage to small blood vessels in the brain (cerebral small vessel disease). What increases the risk? The following factors may make you more likely to develop this condition:  Having had a stroke.  Having high blood pressure (hypertension) or high cholesterol.  Having a disease that affects the heart or blood vessels.  Smoking.  Not being active.  Being over age 95.  Having any of these conditions: ? Diabetes. ? Metabolic syndrome. ? Obesity. ? Depression. ? A genetic condition that leads to stroke, such as CADASIL (cerebral autosomal dominant arteriopathy with subcortical infarcts and leukoencephalopathy). What are the signs or symptoms? Symptoms of vascular dementia can vary from one person to another. Symptoms may be mild or severe depending on the amount of damage and which parts of the brain have been affected. Symptoms may begin suddenly or may develop slowly. Mental symptoms may include:  Confusion and memory problems.  Poor attention and concentration.  Trouble understanding speech.  Depression.  Personality changes.  Trouble recognizing familiar people.  Agitation or aggression.  Paranoia or false beliefs (delusions).  Hallucinations. These involve seeing, hearing, tasting, smelling, or feeling things that are not  real. Physical symptoms may include:  Weakness.  Poor balance.  Loss of bladder or bowel control (incontinence).  Unsteady walking (gait).  Speaking problems. Behavioral symptoms may include:  Getting lost in familiar places.  Problems with planning and judgment.  Trouble following instructions.  Social problems.  Emotional outbursts.  Trouble with daily activities and self-care.  Problems handling money. Symptoms may remain stable, or they may get worse over time. Symptoms of vascular dementia may be similar to those of Alzheimer's disease. The two conditions can occur together (mixed dementia). How is this diagnosed? This condition may be diagnosed based on:  Your medical history and a physical exam.  Symptoms or changes that are reported by friends and family.  Lab tests or other tests that check brain and nervous system function. Tests may include: ? Blood tests. ? Brain imaging tests. ? Tests of movement, speech, and other daily activities (neurological exam). ? Tests of memory, thinking, and problem-solving (neuropsychological or neurocognitive testing). There is not a specific test to diagnose vascular dementia. Diagnosis may involve several specialists. These may include:  A health care provider who specializes in the brain and nervous system (neurologist).  A health care provider who specializes in understanding how problems in the brain can alter behavior and cognitive function (neuropsychologist). How is this treated? There is no cure for vascular dementia. Brain damage that has already occurred cannot be reversed. Treatment depends on:  How severe the condition is.  Which parts of your brain have been affected.  Your overall health. Treatment measures aim to:  Treat the underlying cause of vascular dementia and manage risk factors. This may include: ? Controlling blood pressure or lowering cholesterol. ? Treating diabetes. ? Making lifestyle  changes, such as quitting smoking or losing weight.  Manage symptoms.  Prevent further brain damage.  Improve the person's health and quality of life. Treatment for dementia may involve a team of health care providers, including:  A neurologist.  A provider who specializes in disorders of the mind (psychiatrist).  A provider who specializes in helping people learn daily living skills (occupational therapist).  A provider who focuses on speech and language changes (Doctor, general practice).  A heart specialist (cardiologist).  A provider who helps people learn how to manage physical changes, such as movement and walking (exercise physiologist or physical therapist). Follow these instructions at home: Medicines  Take over-the-counter and prescription medicines only as told by your health care provider.  Use a pill organizer or pill reminder to help you manage your medicines. Lifestyle  Do not use any products that contain nicotine or tobacco, such as cigarettes, e-cigarettes, and chewing tobacco. If you need help quitting, ask your health care provider.  Eat a healthy, balanced diet.  Maintain a healthy weight, or lose weight if needed.  Be physically active as told by your health care provider. General instructions  Work with your health care provider to determine what you need help with and what your safety needs are.  Follow the health care provider's instructions for treating the condition that caused the dementia.  Keep all follow-up visits. This is important. If you are the caregiver: People with vascular dementia may need regular help at home or daily care from a family member or home health care worker. Home care for a person with vascular dementia depends on what caused the condition and how severe the symptoms are. General guidelines for caregivers include:  Help the person with dementia remember people, appointments, and daily activities.  Help the person with  dementia manage his or her medicines.  Help family and friends learn about ways to communicate with the person with dementia.  Create a safe living space to reduce the risk of injury or falls.  Find a support group to help caregivers and family cope with the effects of dementia.   Where to find more information  [employer] [facility] [employer] [facility] Stroke: [email or web address] Contact a health care provider if:  New behavioral problems develop.  Problems with swallowing develop.  Confusion gets worse.  Sleepiness gets worse. Get help right away if:  Loss of consciousness occurs.  There is a sudden loss of speech, balance, or thinking ability.  New numbness or paralysis occurs.  Sudden, severe headache occurs.  Vision is lost or suddenly gets worse in one or both eyes. If you ever feel like the person with dementia may hurt himself or herself or others, or if he or she shares thoughts about taking his or her own life, get help right away. You can go to your nearest emergency department or:  Call your local emergency services (911 in the [place]  Call a suicide crisis helpline, such as the National Suicide Prevention Lifeline at 816-633-5943. This is open 24 hours a day in the [place]  Text the Crisis Text Line at 204-452-2641 (in the [place] Summary  Vascular dementia is a type of dementia. It results from brain damage that is caused by the brain not getting enough blood.  Vascular dementia is caused by conditions that reduce blood flow to the brain. Common causes of this condition include stroke and damage to small blood vessels in the brain.  Treatment focuses on treating the underlying cause of vascular dementia  and managing any risk factors.  People with vascular dementia may need regular help at home or daily care from a family member or home health care worker.  Contact a health care provider if you or your caregiver notices any new symptoms. This information is  not intended to replace advice given to you by your health care provider. Make sure you discuss any questions you have with your health care provider. Document Revised: 09/21/2019 Document Reviewed: 09/21/2019 Elsevier Patient Education  2021 [employer]  Lewy Body Dementia Lewy body dementia, also called dementia with Lewy bodies, is a condition that affects the way the brain functions. It is one form of dementia. In this condition, proteins called Lewy bodies build up in certain areas of the brain. This causes problems with:  Memory.  Decision making.  Behavior.  Speaking.  Thinking and problem solving.  Movements and balance. This condition is progressive, which means that it gets worse with time (is degenerative) and cannot be reversed. What are the causes? This condition is caused by the buildup of Lewy bodies in brain cells in areas of the brain that control memory, thinking, and movement. It is not known what causes the Lewy bodies to build up. What increases the risk? You are more likely to develop this condition if you:  Have a family history of Lewy body dementia or Parkinson's disease.  Are 68 years old or older.  Are male. What are the signs or symptoms? Symptoms of this condition may include:  Seeing things that are not there (hallucinating).  Sleep problems, such as acting out dreams while you are asleep.  Changes in memory, attention, and concentration that come and go (fluctuate).  Symptoms of dementia, such as: ? Trouble with memory. ? Trouble paying attention. ? Problems with planning and organizing. ? Problems with judgment. ? Behavioral problems.  Symptoms of Parkinson's disease, such as: ? Shaking movements that you cannot control (tremor). Tremors usually start in a hand or foot when you are resting (resting tremor). ? Stooped posture. ? Slowing of movement. ? Stiff muscles (rigidity). ? Loss of balance and stability when standing. How is  this diagnosed? This condition is diagnosed by a specialist who diagnoses and treats this condition (neurologist). Your health care provider will talk with you and your family, friends, or caregivers about your history and symptoms. A thorough medical history will be taken, and you will have a physical exam and tests. Tests may include:  Lab tests, such as blood or urine tests.  Imaging tests, such as a CT scan, a PET scan, or an MRI.  A test that involves removing and testing a small amount of the fluid that surrounds the brain and spinal cord (lumbar puncture).  Tests that evaluate brain function, such as memory tests, cognitive tests, and neuropsychological tests. How is this treated? There is no cure for this condition. Treatment focuses on managing your symptoms. Treatment may include:  Medicines to help with hallucinations, sleep, and behavioral problems.  Speech, occupational, and physical therapy. Your health care provider can help direct you to support groups, organizations, and other health care providers who can help with decisions about your care. Follow these instructions at home: Medicines  Take over-the-counter and prescription medicines only as told by your health care provider.  To help you manage your medicines, use a pill organizer or pill reminder.  Avoid taking medicines that can affect thinking, such as pain medicines or certain sleeping medicines. Always check with your health care  provider before taking any new medicines. Lifestyle  Make healthy lifestyle choices: ? Be physically active as told by your health care provider. ? Do not use any products that contain nicotine or tobacco, such as cigarettes, e-cigarettes, and chewing tobacco. If you need help quitting, ask your health care provider. ? Try to practice stress-management techniques when you experience stress, such as mindfulness, yoga, or deep breathing. ? Stay socially connected. Talk regularly with  other people, such as family, friends, and neighbors.  Make sure you sleep well. These tips can help you get a good night's rest: ? Avoid napping during the day. ? Keep your sleeping area dark and cool. ? Avoid exercising a few hours before you go to bed. ? Avoid caffeine products in the evening. Eating and drinking  Do not drink alcohol.  Drink enough fluid to keep your urine pale yellow.  Eat a healthy diet. Safety  Work with your health care provider to determine what you need help with and what your safety needs are.  If you have trouble moving around, use a cane or walker as told by your health care provider.  Make sure your home environment is safe. To do this: ? Remove things that can be a tripping hazard, such as throw rugs or clutter. ? Install grab bars and railings in your home to prevent falls.  Talk with your health care provider about whether it is safe for you to drive.  If you were given a bracelet that identifies you as a person with memory loss or tracks your location, make sure to wear it at all times.   General instructions  Work with your family to make important decisions, such as advance directives, medical power of attorney, or a living will.  Keep all follow-up visits. This is important.   Where to find more information  [employer] Stroke: [email or web address] Contact a health care provider if you have:  New or worsening confusion.  New or worsening trouble with sleeping or increased daytime sleepiness.  Problems with choking or swallowing. Get help right away if:  You feel depressed or sad, or feel that you want to harm yourself.  Your family members become concerned for your safety. If you ever feel like you may hurt yourself or others, or have thoughts about taking your own life, get help right away. Go to your nearest emergency department or:  Call your local emergency services (911 in the [place]  Call  a suicide crisis helpline, such as the National Suicide Prevention Lifeline at 848-862-5298. This is open 24 hours a day in the [place]  Text the Crisis Text Line at 860-764-4712 (in the [place] Summary  Lewy body dementia is a condition that affects the way the brain functions. It causes problems with functions such as memory and thinking.  Lewy body dementia gets worse with time.  This condition is caused by the buildup of proteins called Lewy bodies in brain cells. It is not known what causes the Lewy bodies to build up.  Work with your health care provider to determine what you need help with and what your safety needs are.  Your health care provider can help direct you to support groups, organizations, and other health care providers who can help with decisions about your care. This information is not intended to replace advice given to you by your health care provider. Make sure you discuss any questions you have with your health care provider. Document Revised: 09/21/2019  Document Reviewed: 09/21/2019 Elsevier Patient Education  2021 [employer] Inc.  Lewy Body Dementia Lewy body dementia, also called dementia with Lewy bodies, is a condition that affects the way the brain functions. It is one form of dementia. In this condition, proteins called Lewy bodies build up in certain areas of the brain. This causes problems with: Memory. Decision making. Behavior. Speaking. Thinking and problem solving. Movements and balance. This condition is progressive, which means that it gets worse with time (is degenerative) and cannot be reversed. What are the causes? This condition is caused by the buildup of Lewy bodies in brain cells in areas of the brain that control memory, thinking, and movement. It is not known what causes the Lewy bodies to build up. What increases the risk? You are more likely to develop this condition if you: Have a family history of Lewy body dementia or Parkinson's  disease. Are 47 years old or older. Are male. What are the signs or symptoms? Symptoms of this condition may include: Seeing things that are not there (hallucinating). Sleep problems, such as acting out dreams while you are asleep. Changes in memory, attention, and concentration that come and go (fluctuate). Symptoms of dementia, such as: Trouble with memory. Trouble paying attention. Problems with planning and organizing. Problems with judgment. Behavioral problems. Symptoms of Parkinson's disease, such as: Shaking movements that you cannot control (tremor). Tremors usually start in a hand or foot when you are resting (resting tremor). Stooped posture. Slowing of movement. Stiff muscles (rigidity). Loss of balance and stability when standing. How is this diagnosed? This condition is diagnosed by a specialist who diagnoses and treats this condition (neurologist). Your health care provider will talk with you and your family, friends, or caregivers about your history and symptoms. A thorough medical history will be taken, and you will have a physical exam and tests. Tests may include: Lab tests, such as blood or urine tests. Imaging tests, such as a CT scan, a PET scan, or an MRI. A test that involves removing and testing a small amount of the fluid that surrounds the brain and spinal cord (lumbar puncture). Tests that evaluate brain function, such as memory tests, cognitive tests, and neuropsychological tests. How is this treated? There is no cure for this condition. Treatment focuses on managing your symptoms. Treatment may include: Medicines to help with hallucinations, sleep, and behavioral problems. Speech, occupational, and physical therapy. Your health care provider can help direct you to support groups, organizations, and other health care providers who can help with decisions about your care. Follow these instructions at home: Medicines Take over-the-counter and prescription  medicines only as told by your health care provider. To help you manage your medicines, use a pill organizer or pill reminder. Avoid taking medicines that can affect thinking, such as pain medicines or certain sleeping medicines. Always check with your health care provider before taking any new medicines. Lifestyle Make healthy lifestyle choices: Be physically active as told by your health care provider. Do not use any products that contain nicotine or tobacco, such as cigarettes, e-cigarettes, and chewing tobacco. If you need help quitting, ask your health care provider. Try to practice stress-management techniques when you experience stress, such as mindfulness, yoga, or deep breathing. Stay socially connected. Talk regularly with other people, such as family, friends, and neighbors. Make sure you sleep well. These tips can help you get a good night's rest: Avoid napping during the day. Keep your sleeping area dark and cool. Avoid exercising a  few hours before you go to bed. Avoid caffeine products in the evening. Eating and drinking Do not drink alcohol. Drink enough fluid to keep your urine pale yellow. Eat a healthy diet. Safety Work with your health care provider to determine what you need help with and what your safety needs are. If you have trouble moving around, use a cane or walker as told by your health care provider. Make sure your home environment is safe. To do this: Remove things that can be a tripping hazard, such as throw rugs or clutter. Install grab bars and railings in your home to prevent falls. Talk with your health care provider about whether it is safe for you to drive. If you were given a bracelet that identifies you as a person with memory loss or tracks your location, make sure to wear it at all times.   General instructions Work with your family to make important decisions, such as advance directives, medical power of attorney, or a living will. Keep all  follow-up visits. This is important.   Where to find more information [employer] Stroke: [email or web address] Contact a health care provider if you have: New or worsening confusion. New or worsening trouble with sleeping or increased daytime sleepiness. Problems with choking or swallowing. Get help right away if: You feel depressed or sad, or feel that you want to harm yourself. Your family members become concerned for your safety. If you ever feel like you may hurt yourself or others, or have thoughts about taking your own life, get help right away. Go to your nearest emergency department or: Call your local emergency services (911 in the [place] Call a suicide crisis helpline, such as the National Suicide Prevention Lifeline at 9085617436. This is open 24 hours a day in the [place] Text the Crisis Text Line at 7065809263 (in the [place] Summary Lewy body dementia is a condition that affects the way the brain functions. It causes problems with functions such as memory and thinking. Lewy body dementia gets worse with time. This condition is caused by the buildup of proteins called Lewy bodies in brain cells. It is not known what causes the Lewy bodies to build up. Work with your health care provider to determine what you need help with and what your safety needs are. Your health care provider can help direct you to support groups, organizations, and other health care providers who can help with decisions about your care. This information is not intended to replace advice given to you by your health care provider. Make sure you discuss any questions you have with your health care provider. Document Revised: 09/21/2019 Document Reviewed: 09/21/2019 Elsevier Patient Education  2021 [employer] Inc[employer]   Alzheimer's Disease Alzheimer's disease is a brain disease that affects memory, thinking, language, and behavior. People with Alzheimer's disease lose mental abilities,  and the disease gets worse over time. Alzheimer's disease is a form of dementia. What are the causes? This condition develops when a protein called beta-amyloid forms deposits in the brain. It is not known what causes these deposits to form. Alzheimer's disease may also be caused by a gene mutation that is inherited from one parent or both parents. A gene mutation is a harmful change in a gene. Not everyone who inherits the genetic mutation will get the disease. What increases the risk? You are more likely to develop this condition if you:  Are older than age 7.  Are male.  Have any of these medical  conditions: ? High blood pressure. ? Diabetes. ? Heart or blood vessel disease.  Smoke.  Have obesity.  Have had a brain injury.  Have had a stroke.  Have a family history of dementia. What are the signs or symptoms? Symptoms of this condition may happen in three stages, which often overlap. Early stage In this stage, you may continue to be independent. You may still be able to drive, work, and be social. Symptoms in this stage include:  Minor memory problems, such as forgetting a name, words, or what you did recently.  Difficulty with: ? Paying attention. ? Communicating. ? Doing familiar tasks. ? Problem solving or doing calculations. ? Following instructions. ? Learning new things.  Anxiety.  Social withdrawal.  Loss of motivation. Moderate stage In this stage, you will start to need care. Symptoms in this stage include:  Difficulty with expressing thoughts.  Memory loss that affects daily life. This can include forgetting: ? Recent events that have happened. ? If you have taken medicines or eaten. ? Familiar places. You may get lost while walking or driving. ? To pay bills or manage finances. ? Personal hygiene such as bathing or using the bathroom.  Confusion about where you are or what time it is.  Difficulty in judging distance.  Changes in  personality, mood, and behavior. You may be moody, irritable, angry, frustrated, fearful, anxious, or suspicious.  Poor reasoning and judgment.  Delusions or hallucinations.  Changes in sleep patterns. Severe stage In the final stage, you will need help with your personal care and daily activities. Symptoms in this stage include:  Worsening memory loss.  Personality changes.  Loss of awareness of your surroundings.  Changes in physical abilities, including the ability to walk, sit, and swallow.  Difficulty in communicating.  Inability to control your bladder and bowels.  Increasing confusion.  Increasing behavior changes. How is this diagnosed? This condition is diagnosed by a health care provider who specializes in diseases of the nervous system (neurologist) or one who specializes in care of the elderly (geriatrician or geriatric psychiatrist). Other causes of dementia may also be ruled out. Your health care provider will talk with you and your family, friends, or caregivers about your history and symptoms. A thorough medical history will be taken, and you will have a physical exam and tests. Tests may include:  Lab tests, such as blood or urine tests.  Imaging tests, such as a CT scan, a PET scan, or an MRI.  A lumbar puncture. This test involves removing and testing a small amount of the fluid that surrounds the brain and spinal cord.  An electroencephalogram (EEG). In this test, small metal discs are used to measure electrical activity in the brain.  Memory tests, cognitive tests, and neuropsychological tests. These tests evaluate brain function.  Genetic testing. This may be done if you have early onset of the disease (before age 32) or if other family members have the disease.   How is this treated? At this time, there is no treatment to cure Alzheimer's disease or stop it from getting worse. The goals of treatment are:  To manage behavioral changes.  To provide  you with a safe environment.  To help manage daily life for you and your caregivers. The following treatment options are available:  Medicines. Medicines may help the memory work better and manage behavioral symptoms.  Cognitive therapy. Cognitive therapy provides you with education, support, and memory aids. It is most helpful in the early stages  of the condition.  Counseling or spiritual guidance. It is normal to have a lot of feelings, including anger, relief, fear, and isolation. Counseling and guidance can help you deal with these feelings.  Caregiving. This involves having caregivers help you with your daily activities.  Family support groups. These provide education, emotional support, and information about community resources to family members who are taking care of you. Follow these instructions at home: Medicines  Take over-the-counter and prescription medicines only as told by your health care provider.  Use a pill organizer or pill reminder to help you manage your medicines.  Avoid taking medicines that can affect thinking, such as pain medicines or sleeping medicines. Lifestyle  Make healthy lifestyle choices: ? Be physically active as told by your health care provider. Regular exercise may help improve symptoms. ? Do not use any products that contain nicotine or tobacco, such as cigarettes, e-cigarettes, and chewing tobacco. If you need help quitting, ask your health care provider. ? Do not drink alcohol. ? Eat a healthy diet. ? Practice stress-management techniques when you get stressed. ? Stay social.  Drink enough fluid to keep your urine pale yellow.  Make sure to get quality sleep. ? Avoid taking long naps during the day. Take short naps of 30 minutes or less if needed. ? Keep your sleeping area dark and cool. ? Avoid exercising during the few hours before you go to bed. ? Avoid caffeine products in the afternoon and evening. General instructions  Work with  your health care provider to determine what you need help with and what your safety needs are.  If you were given a bracelet that identifies you as a person with memory loss or tracks your location, make sure to wear it at all times.  Talk with your health care provider about whether it is safe for you to drive.  Work with your family to make important decisions, such as advance directives, medical power of attorney, or a living will.  Keep all follow-up visits. This is important.   Where to find more information  The Alzheimer's Association: Call the 24-hour helpline at 531-756-8945, or visit [employer]hu Contact a health care provider if:  You have nausea, vomiting, or trouble with eating related to a medicine.  You have worsening mood or behavior changes, such as depression, anxiety, or hallucinations.  You or your family members become concerned for your safety. Get help right away if:  You become less responsive or are difficult to wake up.  Your memory suddenly gets worse.  You feel that you want to harm yourself. If you ever feel like you may hurt yourself or others, or have thoughts about taking your own life, get help right away. Go to your nearest emergency department or:  Call your local emergency services (911 in the [place]  Call a suicide crisis helpline, such as the National Suicide Prevention Lifeline at 4691815057. This is open 24 hours a day in the [place]  Text the Crisis Text Line at 206-500-4523 (in the [place] Summary  Alzheimer's disease is a brain disease that affects memory, thinking, language, and behavior. Alzheimer's disease is a form of dementia.  This condition is diagnosed by a specialist in diseases of the nervous system (neurologist) or one who specializes in care of the elderly.  At this time, there is no treatment to cure Alzheimer's disease or stop it from getting worse. The goal of treatment is to help you manage any symptoms.  Work with your  family to make important decisions, such as advance directives, medical power of attorney, or a living will. This information is not intended to replace advice given to you by your health care provider. Make sure you discuss any questions you have with your health care provider. Document Revised: 08/24/2019 Document Reviewed: 08/24/2019 Elsevier Patient Education  2021 Elsevier Inc.  Frontotemporal Dementia  Frontotemporal dementia (FTD), sometimes called semantic dementia or Pick's disease, is a progressive brain disorder that causes memory loss. FTD describes a range of diseases that often start with changes in behavior, speech, and thinking. As FTD progresses, it affects short-term memory. Over time, FTD causes the frontal and temporal lobes of the brain to shrink. These are the parts of the brain that control behavior and speech. There are multiple types of FTD, including:  Behavioral variant FTD. This is the most common type.  Primary progressive aphasia.  Frontotemporal dementia with motor neuron disease. FTD is one of the most common causes of progressive memory loss in people younger than age 71. The disease progresses faster in some people than in others. In some families, FTD can be associated with amyotrophic lateral sclerosis (ALS). There is no cure for FTD, but treatment and supportive care can improve a person's quality of life. What are the causes? The exact cause of this condition is not known, although many genetic changes (mutations) are known to cause the disease. What increases the risk? This condition is more likely to occur in people who have a family history of FTD. Family members of people with FTD should think about genetic counseling. What are the signs or symptoms? Symptoms of FTD usually start when a person is 73-65 years of age. Symptoms may include:  Impulsive, poorly controlled, inappropriate, or embarrassing behavior.  Lack of motivation and interest  (apathy).  Irritability and agitation.  Neglect of personal hygiene.  Withdrawal.  Inappropriate crying or laughing (pseudobulbar affect).  Repetitive behaviors.  Impulsive eating.  Lack of concern for others.  Failure to recognize behavioral changes.  Speech problems, such as: ? Loss of the ability to speak fluently. ? Slurred speech. ? Loss of vocabulary.  Inability to write or read.  New drug or alcohol abuse. Short-term memory loss is also a symptom later in the disease. How is this diagnosed? Your health care provider may suspect FTD if you have worsening behavior or speech difficulties. You may need to see specialists in brain and behavioral health who will collect your medical history and do a neurological exam. The following tests may be done:  Blood tests to rule out other causes, such as vitamin deficiency, harmful effects of substances (toxicities), and infections.  Spinal tap (lumbar puncture) to check spinal fluid samples for abnormal proteins.  Imaging tests, such as a CT scan, a PET scan, or an MRI. These can show brain changes that suggest FTD or another brain disorder.  Memory testing (neuropsychological testing), which involves several hours of standardized tests to check the many functions of the brain. How is this treated? There is no cure for this condition, and the progression of FTD cannot be stopped. Support at home is the most important aspect of managing FTD. Ask about caregiving resources in your community. Management of this condition may include:  Antidepressant medicines to help with apathy.  Medicines to treat pseudobulbar affect.  Sedative medicines to control aggressive or dangerous behavior.  Speech and language therapy.  Behavioral therapy.  Occupational therapy to help with home safety and activity. Institutional or supportive  care may eventually be needed. Follow these instructions at home: Eating and drinking  Follow a  healthy diet. Eat foods that are high in fiber, such as beans, whole grains, and fresh fruits and vegetables.  Avoid having too much sugar and caffeine in your diet.  Watch for signs of compulsive eating, which can lead to other health problems.  Do not drink alcohol.  Drink enough fluid to keep your urine pale yellow. Lifestyle  Keep a regular routine.  Avoid stress and new situations.  Avoid any activities that may trigger aggressive behavior.  Schedule regular, enjoyable, and supervised physical activity. General instructions  Take over-the-counter and prescription medicines only as told by your health care provider.  If you were given a bracelet that tracks your location, make sure you wear it.  Work with your health care provider to determine what you need help with and what your safety needs are.  Keep all follow-up visits, including any therapy visits. This is important. Where to find more information  [employer] of Neurological Disorders and Stroke: [employer] Contact a health care provider if:  Your symptoms change or get worse.  It becomes more difficult or stressful to be cared for at home. Get help right away if:  It is no longer possible to be cared for at home.  You or your family members become concerned for your safety.  You threaten yourself or others. If you ever feel like you may hurt yourself or others, or have thoughts about taking your own life, get help right away. Go to your nearest emergency department or:  Call your local emergency services (911 in the [place]  Call a suicide crisis helpline, such as the National Suicide Prevention Lifeline at 781-837-3389. This is open 24 hours a day in the [place]  Text the Crisis Text Line at 260-779-4500 (in the [place] Summary  Frontotemporal dementia (FTD) is a progressive brain disorder that causes memory loss.  There is no cure for this condition, and the progression of FTD cannot be  stopped.  Support at home is the most important aspect of managing FTD. Ask about caregiving resources in your community.  Work with your health care provider to determine what you need help with and what your safety needs are. This information is not intended to replace advice given to you by your health care provider. Make sure you discuss any questions you have with your health care provider. Document Revised: 09/21/2019 Document Reviewed: 09/21/2019 Elsevier Patient Education  2021 [employer].

## 2020-10-21 NOTE — Telephone Encounter (Signed)
Left another message for [name], pt daughter, to call me back. I need to confirm patient is taking rosuvastatin 40mg  daily, niacin 500mg  at bedtime, fenofibrate 130mg  daily.  The prior authorization was approved for Praluent. However when I spoke with patient in the clinic, I thought we could get him the [employer] grant. But now the fund has closed. When I asked the patient if he could afford $126/90 days patient did not seem to know. He gave me permission to speak with his daughter about his medications (plus she is on ). The only # in the chart is for his daughter.

## 2020-10-23 ENCOUNTER — Other Ambulatory Visit: Payer: Self-pay

## 2020-10-23 ENCOUNTER — Emergency Department (INDEPENDENT_AMBULATORY_CARE_PROVIDER_SITE_OTHER)
Admission: EM | Admit: 2020-10-23 | Discharge: 2020-10-23 | Disposition: A | Discharge status: Home / Self Care | Payer: Medicare Other | Source: Home / Self Care

## 2020-10-23 DIAGNOSIS — L0291 Cutaneous abscess, unspecified: Secondary | ICD-10-CM

## 2020-10-23 HISTORY — DX: Sleep apnea, unspecified: G47.30

## 2020-10-23 HISTORY — DX: Unspecified dementia, unspecified severity, without behavioral disturbance, psychotic disturbance, mood disturbance, and anxiety: F03.90

## 2020-10-23 MED ORDER — AMOXICILLIN-POT CLAVULANATE 875-125 MG PO TABS: 1.0000 | ORAL_TABLET | Freq: Two times a day (BID) | ORAL | 0 refills | Status: DC

## 2020-10-23 NOTE — Discharge Instructions (Addendum)
Change dressing daily or when soiled and continue dressing changes until any drainage ceases Take antibiotic as prescribed

## 2020-10-23 NOTE — ED Triage Notes (Signed)
Pt states that he has an abscess on his back. x1 month

## 2020-10-23 NOTE — ED Provider Notes (Signed)
[facility]    CSN: [id number] Arrival date & time: 10/23/20  1442      History   Chief Complaint Chief Complaint  Patient presents with  . Abscess    Pt states that he has an abscess on his back. X1 month    HPI Kentravious Lipford is a 70 y.o. male.   Patient is a resident and independent care at [facility].  Presents today with his daughter-in-law for an abscess on his back.  HPI  Past Medical History:  Diagnosis Date  . Dementia (HCC)   . DM (diabetes mellitus) (HCC)   . Edema leg   . High cholesterol   . History of MI (myocardial infarction)   . Hypertension   . Kidney disease   . 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 Sleep apnea     Patient Active Problem List   Diagnosis Date Noted  . Hyperlipidemia 04/22/2020  . Coronary artery disease 10/22/2017    Past Surgical History:  Procedure Laterality Date  . APPENDECTOMY    . CHOLECYSTECTOMY         Home Medications    Prior to Admission medications   Medication Sig Start Date End Date Taking? Authorizing Provider  aspirin EC 81 MG tablet Take 81 mg by mouth daily. Swallow whole.   Yes [provider]  calcium carbonate (TUMS - DOSED IN MG ELEMENTAL CALCIUM) 500 MG chewable tablet Chew 500 mg by mouth daily as needed for indigestion or heartburn.   Yes [provider]  Continuous Blood Gluc Sensor (FREESTYLE LIBRE 14 DAY SENSOR) MISC Apply topically as directed. 09/27/20  Yes [provider]  fenofibrate micronized (ANTARA) 130 MG capsule Take 130 mg by mouth daily before breakfast.   Yes [provider]  furosemide (LASIX) 40 MG tablet Take 40 mg by mouth.   Yes [provider]  gabapentin (NEURONTIN) 100 MG capsule Take 100 mg by mouth 2 (two) times daily.   Yes [provider]  insulin aspart protamine- aspart (NOVOLOG MIX 70/30) (70-30) 100 UNIT/ML injection Inject 40 Units into the skin 2 (two) times  daily as needed (blood sugar over 150).   Yes [provider]  losartan (COZAAR) 25 MG tablet Take 25 mg by mouth daily. 09/16/20  Yes [provider]  midodrine (PROAMATINE) 5 MG tablet Take 5 mg by mouth 2 (two) times daily.   Yes [provider]  niacin 500 MG tablet Take 500 mg by mouth at bedtime.   Yes [provider]  omeprazole (PRILOSEC) 20 MG capsule Take 1 capsule by mouth daily. 09/01/20  Yes [provider]  predniSONE (DELTASONE) 10 MG tablet 30 tablets.   Yes [provider]  rosuvastatin (CRESTOR) 40 MG tablet Take 1 tablet (40 mg total) by mouth daily. 06/22/20  Yes Pricilla Riffle, MD    Family History Family History  Problem Relation Age of Onset  . Dementia Neg Hx     Social History Social History   Tobacco Use  . Smoking status: Former Games developer  . Smokeless tobacco: Never Used  Vaping Use  . Vaping Use: Never used  Substance Use Topics  . Alcohol use: Not Currently  . Drug use: Not Currently     Allergies   Tramadol   Review of Systems Review of Systems  Skin: Positive for wound.       Left upper back  All other systems reviewed  and are negative.    Physical Exam Triage Vital Signs ED Triage Vitals  Enc Vitals Group     BP 10/23/20 1516 98/66     Pulse Rate 10/23/20 1516 83     Resp --      Temp 10/23/20 1516 99.3 F (37.4 C)     Temp Source 10/23/20 1516 Oral     SpO2 10/23/20 1516 96 %     Weight 10/23/20 1513 209 lb (94.8 kg)     Height 10/23/20 1513 5' 11.5" (1.816 m)     Head Circumference --      Peak Flow --      Pain Score 10/23/20 1512 0     Pain Loc --      Pain Edu? --      Excl. in GC? --    No data found.  Updated Vital Signs BP 98/66 (BP Location: Right Arm)   Pulse 83   Temp 99.3 F (37.4 C) (Oral)   Ht 5' 11.5" (1.816 m)   Wt 94.8 kg   SpO2 96%   BMI 28.74 kg/m   Visual Acuity Right Eye Distance:   Left Eye Distance:   Bilateral Distance:    Right Eye  Near:   Left Eye Near:    Bilateral Near:     Physical Exam Vitals and nursing note reviewed.  Constitutional:      Appearance: Normal appearance.  Skin:    Comments: Procedure note.  Patient had fluctuant area on left upper back that started draining as soon as the skin was anesthetized and incision was made and copious foul-smelling pus was drained.  It continued to drain for what seemed like many minutes.  Patient tolerated this well and after no further drainage could be expressed a dressing was applied.  Cultures were taken for both aerobic and anaerobic organisms  Neurological:     Mental Status: He is alert.      UC Treatments / Results  Labs (all labs ordered are listed, but only abnormal results are displayed) Labs Reviewed - No data to display  EKG   Radiology No results found.  Procedures Procedures (including critical care time)  Medications Ordered in UC Medications - No data to display  Initial Impression / Assessment and Plan / UC Course  I have reviewed the triage vital signs and the nursing notes.  Pertinent labs & imaging results that were available during my care of the patient were reviewed by me and considered in my medical decision making (see chart for details).     Skin abscess, I&D Final Clinical Impressions(s) / UC Diagnoses   Final diagnoses:  None   Discharge Instructions   None    ED Prescriptions    None     PDMP not reviewed this encounter.   Frederica Kuster, MD 10/23/20 [phone]

## 2020-10-25 ENCOUNTER — Telehealth: Payer: Self-pay

## 2020-10-25 ENCOUNTER — Encounter: Payer: Self-pay | Admitting: Neurology

## 2020-10-25 ENCOUNTER — Other Ambulatory Visit: Payer: Medicare Other

## 2020-10-25 DIAGNOSIS — R4189 Other symptoms and signs involving cognitive functions and awareness: Secondary | ICD-10-CM | POA: Insufficient documentation

## 2020-10-25 DIAGNOSIS — F0390 Unspecified dementia without behavioral disturbance: Secondary | ICD-10-CM | POA: Insufficient documentation

## 2020-10-25 MED ORDER — PRALUENT 75 MG/ML ~~LOC~~ SOAJ: 1.0000 "pen " | SUBCUTANEOUS | 3 refills | Status: DC

## 2020-10-25 NOTE — Telephone Encounter (Signed)
Referral for neuro psych testing sent to [facility]. P: [id number].

## 2020-10-25 NOTE — Telephone Encounter (Signed)
Pt's daughter returned call to clinic. States she uses an automatic pill dispenser and sets up pt's meds so it would be very hard for him to be missing doses without her noticing unless he's throwing his pills away which she does not think he is. She confirms that she is putting rosuvastatin, niacin, and fenofibrate in his pill dispenser. Discussed cost of Praluent, she is ok with this. Rx sent to pharmacy, follow up labs scheduled in August to assess efficacy.

## 2020-10-28 LAB — B12 AND FOLATE PANEL
Folate: 11.2 ng/mL (ref >3.0)
Vitamin B-12: 701 pg/mL (ref 232–1245)

## 2020-10-28 LAB — RPR: RPR Ser Ql: NONREACTIVE

## 2020-10-28 LAB — METHYLMALONIC ACID, SERUM: Methylmalonic Acid: 303 nmol/L (ref 0–378)

## 2020-10-28 LAB — HOMOCYSTEINE: Homocysteine: 17.1 umol/L (ref 0.0–17.2)

## 2020-10-28 LAB — TSH: TSH: 1.8 u[IU]/mL (ref 0.450–4.500)

## 2020-10-28 LAB — AMMONIA: Ammonia: 41 ug/dL (ref 40–200)

## 2020-10-28 LAB — VITAMIN B1: Thiamine: 133.7 nmol/L (ref 66.5–200.0)

## 2020-10-29 LAB — ANAEROBIC AND AEROBIC CULTURE
MICRO NUMBER:: 11972618
MICRO NUMBER:: 11972619
SPECIMEN QUALITY:: ADEQUATE
SPECIMEN QUALITY:: ADEQUATE

## 2020-10-30 ENCOUNTER — Encounter (HOSPITAL_COMMUNITY): Payer: Self-pay | Admitting: Emergency Medicine

## 2020-10-30 ENCOUNTER — Emergency Department (HOSPITAL_COMMUNITY)
Admission: EM | Admit: 2020-10-30 | Discharge: 2020-10-31 | Disposition: A | Discharge status: Home / Self Care | Payer: Medicare Other | Attending: Emergency Medicine | Admitting: Emergency Medicine

## 2020-10-30 ENCOUNTER — Emergency Department (HOSPITAL_COMMUNITY): Payer: Medicare Other

## 2020-10-30 ENCOUNTER — Other Ambulatory Visit: Payer: Self-pay

## 2020-10-30 DIAGNOSIS — F0390 Unspecified dementia without behavioral disturbance: Secondary | ICD-10-CM | POA: Diagnosis not present

## 2020-10-30 DIAGNOSIS — E1165 Type 2 diabetes mellitus with hyperglycemia: Secondary | ICD-10-CM | POA: Insufficient documentation

## 2020-10-30 DIAGNOSIS — Z87891 Personal history of nicotine dependence: Secondary | ICD-10-CM | POA: Diagnosis not present

## 2020-10-30 DIAGNOSIS — Z79899 Other long term (current) drug therapy: Secondary | ICD-10-CM | POA: Insufficient documentation

## 2020-10-30 DIAGNOSIS — Z794 Long term (current) use of insulin: Secondary | ICD-10-CM | POA: Insufficient documentation

## 2020-10-30 DIAGNOSIS — R739 Hyperglycemia, unspecified: Secondary | ICD-10-CM | POA: Diagnosis present

## 2020-10-30 DIAGNOSIS — I2510 Atherosclerotic heart disease of native coronary artery without angina pectoris: Secondary | ICD-10-CM | POA: Diagnosis not present

## 2020-10-30 DIAGNOSIS — U071 COVID-19: Secondary | ICD-10-CM | POA: Insufficient documentation

## 2020-10-30 DIAGNOSIS — I10 Essential (primary) hypertension: Secondary | ICD-10-CM | POA: Insufficient documentation

## 2020-10-30 DIAGNOSIS — Z7982 Long term (current) use of aspirin: Secondary | ICD-10-CM | POA: Diagnosis not present

## 2020-10-30 LAB — COMPREHENSIVE METABOLIC PANEL
ALT: 35 U/L (ref 0–44)
AST: 22 U/L (ref 15–41)
Albumin: 3.4 g/dL — ABNORMAL LOW (ref 3.5–5.0)
Alkaline Phosphatase: 36 U/L — ABNORMAL LOW (ref 38–126)
Anion gap: 8 (ref 5–15)
BUN: 39 mg/dL — ABNORMAL HIGH (ref 8–23)
CO2: 24 mmol/L (ref 22–32)
Calcium: 8.7 mg/dL — ABNORMAL LOW (ref 8.9–10.3)
Chloride: 105 mmol/L (ref 98–111)
Creatinine, Ser: 2.12 mg/dL — ABNORMAL HIGH (ref 0.61–1.24)
GFR, Estimated: 33 mL/min — ABNORMAL LOW (ref >60)
Glucose, Bld: 368 mg/dL — ABNORMAL HIGH (ref 70–99)
Potassium: 3.9 mmol/L (ref 3.5–5.1)
Sodium: 137 mmol/L (ref 135–145)
Total Bilirubin: 0.9 mg/dL (ref 0.3–1.2)
Total Protein: 5.9 g/dL — ABNORMAL LOW (ref 6.5–8.1)

## 2020-10-30 LAB — CBC
HCT: 30.9 % — ABNORMAL LOW (ref 39.0–52.0)
Hemoglobin: 10.8 g/dL — ABNORMAL LOW (ref 13.0–17.0)
MCH: 30.5 pg (ref 26.0–34.0)
MCHC: 35 g/dL (ref 30.0–36.0)
MCV: 87.3 fL (ref 80.0–100.0)
Platelets: 194 K/uL (ref 150–400)
RBC: 3.54 MIL/uL — ABNORMAL LOW (ref 4.22–5.81)
RDW: 13.6 % (ref 11.5–15.5)
WBC: 14.2 K/uL — ABNORMAL HIGH (ref 4.0–10.5)
nRBC: 0 % (ref 0.0–0.2)

## 2020-10-30 LAB — CBG MONITORING, ED
Glucose-Capillary: 358 mg/dL — ABNORMAL HIGH (ref 70–99)
Glucose-Capillary: 296 mg/dL — ABNORMAL HIGH (ref 70–99)

## 2020-10-30 MED ORDER — SODIUM CHLORIDE 0.9 % IV BOLUS
1000.0000 mL | Freq: Once | INTRAVENOUS | Status: AC
  Administered 2020-10-30: 1000 mL via INTRAVENOUS

## 2020-10-30 MED ORDER — INSULIN ASPART 100 UNIT/ML IJ SOLN
6.0000 [IU] | Freq: Once | INTRAMUSCULAR | Status: DC
  Filled 2020-10-30: qty 0.06

## 2020-10-30 MED ORDER — INSULIN ASPART 100 UNIT/ML IJ SOLN
4.0000 [IU] | Freq: Once | INTRAMUSCULAR | Status: AC
  Administered 2020-10-30: 4 [IU] via INTRAVENOUS
  Filled 2020-10-30: qty 0.04

## 2020-10-30 NOTE — ED Provider Notes (Signed)
[facility]-EMERGENCY DEPT Provider Note   CSN: [id number] Arrival date & time: 10/30/20  2045     History Chief Complaint  Patient presents with   Altered Mental Status   Hyperglycemia   Covid Positive    Anthony Morse is a 70 y.o. male.  The history is provided by the patient and medical records.  Altered Mental Status Hyperglycemia Associated symptoms: altered mental status    70 y.o. M with hx of dementia, DM, HLP, HTN, sleep apnea, presenting to the ED for hyperglycemia.  Patient reports he has known covid, tested positive last week 10/27/20.  He states he has not had any cough, fever, or other URI symptoms from this.  Facility reported AMS this AM-- patient reports he was moved to a different room last night and when he woke up this morning he was slightly confused but reoriented himself afterwards.  He denies any feeling of confusion at present.  He states his blood sugar has been high over the past few days, he is currently on steroids for his covid infection.  He states he has still been receiving his other diabetic medications but they really did not let him eat today because of his sugar.    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, new Dx, on Prednisone   Sleep apnea     Patient Active Problem List   Diagnosis Date Noted   Cognitive decline 10/25/2020   Hyperlipidemia 04/22/2020   Coronary artery disease 10/22/2017    Past Surgical History:  Procedure Laterality Date   APPENDECTOMY     CHOLECYSTECTOMY         Family History  Problem Relation Age of Onset   Dementia Neg Hx     Social History   Tobacco Use   Smoking status: Former    Pack years: 0.00   Smokeless tobacco: Never  Vaping Use   Vaping Use: Never used   Substance Use Topics   Alcohol use: Not Currently   Drug use: Not Currently    Home Medications Prior to Admission medications   Medication Sig Start Date End Date Taking? Authorizing Provider  Alirocumab (PRALUENT) 75 MG/ML SOAJ Inject 1 pen into the skin every 14 (fourteen) days. 10/25/20   Pricilla Riffle, MD  amoxicillin-clavulanate (AUGMENTIN) 875-125 MG tablet Take 1 tablet by mouth every 12 (twelve) hours. 10/23/20   Frederica Kuster, MD  aspirin EC 81 MG tablet Take 81 mg by mouth daily. Swallow whole.    [provider]  calcium carbonate (TUMS - DOSED IN MG ELEMENTAL CALCIUM) 500 MG chewable tablet Chew 500 mg by mouth daily as needed for indigestion or heartburn.    [provider]  Continuous Blood Gluc Sensor (FREESTYLE LIBRE 14 DAY SENSOR) MISC Apply topically as directed. 09/27/20   [provider]  fenofibrate micronized (ANTARA) 130 MG capsule Take 130 mg by mouth daily before breakfast.    [provider]  furosemide (LASIX) 40 MG tablet Take 40 mg by mouth.    [provider]  gabapentin (NEURONTIN) 100 MG capsule Take 100 mg by mouth 2 (two) times daily.    [provider]  insulin aspart protamine- aspart (NOVOLOG MIX  70/30) (70-30) 100 UNIT/ML injection Inject 40 Units into the skin 2 (two) times daily as needed (blood sugar over 150).    [provider]  losartan (COZAAR) 25 MG tablet Take 25 mg by mouth daily. 09/16/20   [provider]  midodrine (PROAMATINE) 5 MG tablet Take 5 mg by mouth 2 (two) times daily.    [provider]  niacin 500 MG tablet Take 500 mg by mouth at bedtime.    [provider]  omeprazole (PRILOSEC) 20 MG capsule Take 1 capsule by mouth daily. 09/01/20   [provider]  predniSONE (DELTASONE) 10 MG tablet 30 tablets.    [provider]  rosuvastatin (CRESTOR) 40 MG tablet Take 1 tablet (40 mg total) by mouth daily. 06/22/20   Pricilla Riffle, MD     Allergies    Tramadol  Review of Systems   Review of Systems  Unable to perform ROS: Dementia   Physical Exam Updated Vital Signs BP 118/82   Pulse 76   Temp 97.8 F (36.6 C) (Oral)   Resp 13   Ht 5' 11.5" (1.816 m)   Wt 95.3 kg   SpO2 97%   BMI 28.88 kg/m   Physical Exam Vitals and nursing note reviewed.  Constitutional:      Appearance: He is well-developed.  HENT:     Head: Normocephalic and atraumatic.  Eyes:     Conjunctiva/sclera: Conjunctivae normal.     Pupils: Pupils are equal, round, and reactive to light.  Cardiovascular:     Rate and Rhythm: Normal rate and regular rhythm.     Heart sounds: Normal heart sounds.  Pulmonary:     Effort: Pulmonary effort is normal. No respiratory distress.     Breath sounds: Normal breath sounds. No rhonchi.     Comments: Lungs CTAB, speaking in full sentences without difficulty, no cough or labored breathing observed, O2 sats 99% on RA Abdominal:     General: Bowel sounds are normal.     Palpations: Abdomen is soft.  Musculoskeletal:        General: Normal range of motion.     Cervical back: Normal range of motion.  Skin:    General: Skin is warm and dry.  Neurological:     Mental Status: He is alert and oriented to person, place, and time.     Comments: Awake, alert, oriented x3, able to answer questions and follow commands, does not appear confused or encephalopathic    ED Results / Procedures / Treatments   Labs (all labs ordered are listed, but only abnormal results are displayed) Labs Reviewed  COMPREHENSIVE METABOLIC PANEL - Abnormal; Notable for the following components:      Result Value   Glucose, Bld 368 (*)    BUN 39 (*)    Creatinine, Ser 2.12 (*)    Calcium 8.7 (*)    Total Protein 5.9 (*)    Albumin 3.4 (*)    Alkaline Phosphatase 36 (*)    GFR, Estimated 33 (*)    All other components within normal limits  CBC - Abnormal; Notable for the following components:   WBC 14.2 (*)    RBC 3.54  (*)    Hemoglobin 10.8 (*)    HCT 30.9 (*)    All other components within normal limits  CBG MONITORING, ED - Abnormal; Notable for the following components:   Glucose-Capillary 358 (*)    All other components within normal limits  URINALYSIS, ROUTINE W  REFLEX MICROSCOPIC    EKG None  Radiology No results found.  Procedures Procedures   Medications Ordered in ED Medications - No data to display  ED Course  I have reviewed the triage vital signs and the nursing notes.  Pertinent labs & imaging results that were available during my care of the patient were reviewed by me and considered in my medical decision making (see chart for details).    MDM Rules/Calculators/A&P  70 year old male presenting to the ED with reported change in mental status and hyperglycemia.  He was diagnosed COVID-positive 3 days ago and due to this, was moved to a new room in his facility.  He does admit this call some confusion this morning upon waking, but was able to reorient himself and is feeling better now.  He is currently on steroids due to his COVID infection.  He actually denies any symptoms from his COVID whatsoever, no cough, fever, chest pain, or shortness of breath.  He has not had any vomiting or diarrhea either.  He is awake, alert, appropriately oriented here in the ED.  He is able to answer all of my questions and follow commands without difficulty.  His lungs are clear and vitals are stable on room air.  Labs with signs of CKD but this appears stable from January.  His BUN is slightly elevated, possibly some mild dehydration.  Initial CBG 358 but with gentle hydration and small dose of insulin, this improved to 148.  No signs of DKA.  UA without signs of infection.  Attempted to get CXR, however patient has continuous glucose monitor on his arm.  Patient did not want this removed as he does not have another replacement meter at present.  He remains stable on RA without cough or other respiratory  symptoms at present.  His hyperglycemia appears to be related to steroid use.  He remains without any signs of altered mental status here in the ED.  I feel he is stable for discharge home.  Facility may need to temporarily adjust his sliding scale to accommodate for steroids, I have written instructions for same on his discharge paperwork.  Can follow-up with PCP.  Return here for new concerns.  Final Clinical Impression(s) / ED Diagnoses Final diagnoses:  Hyperglycemia    Rx / DC Orders ED Discharge Orders     None        Garlon Hatchet, PA-C 10/31/20 Tomasa Hosteller, MD 10/31/20 1544

## 2020-10-30 NOTE — ED Triage Notes (Signed)
Pt arrived via EMS from [facility]. Pt has hx of dementia, but is more confused than usual. Pt has hx of diabetes and has been taking steroids for Covid. Pt's sugar was 489 when EMS arrived. EMS gave him 500cc of fluid and his second sugar was 386.

## 2020-10-31 ENCOUNTER — Other Ambulatory Visit: Payer: Medicare Other

## 2020-10-31 DIAGNOSIS — U071 COVID-19: Secondary | ICD-10-CM | POA: Diagnosis not present

## 2020-10-31 LAB — URINALYSIS, ROUTINE W REFLEX MICROSCOPIC
Bacteria, UA: NONE SEEN
Bilirubin Urine: NEGATIVE
Glucose, UA: >=500 mg/dL — AB
Hgb urine dipstick: NEGATIVE
Ketones, ur: NEGATIVE mg/dL
Leukocytes,Ua: NEGATIVE
Nitrite: NEGATIVE
Protein, ur: NEGATIVE mg/dL
Specific Gravity, Urine: 1.023 (ref 1.005–1.030)
pH: 6 (ref 5.0–8.0)

## 2020-10-31 LAB — CBG MONITORING, ED: Glucose-Capillary: 148 mg/dL — ABNORMAL HIGH (ref 70–99)

## 2020-10-31 NOTE — Discharge Instructions (Signed)
Blood sugars are elevated due to the steroids. Last CBG 148 at time of discharge.   May need to adjust sliding scale to accommodate for steroids.   Follow-up with primary care doctor. Return here for new concerns.

## 2020-11-01 ENCOUNTER — Encounter: Payer: Self-pay | Admitting: Counselor

## 2020-11-03 ENCOUNTER — Ambulatory Visit
Admission: RE | Admit: 2020-11-03 | Discharge: 2020-11-03 | Disposition: A | Discharge status: Home / Self Care | Payer: Medicare Other | Source: Ambulatory Visit | Attending: Nephrology | Admitting: Nephrology

## 2020-11-03 ENCOUNTER — Other Ambulatory Visit: Payer: Self-pay

## 2020-11-03 ENCOUNTER — Ambulatory Visit
Admission: RE | Admit: 2020-11-03 | Discharge: 2020-11-03 | Disposition: A | Discharge status: Home / Self Care | Payer: Medicare Other | Source: Ambulatory Visit | Attending: Neurology | Admitting: Neurology

## 2020-11-03 DIAGNOSIS — R4189 Other symptoms and signs involving cognitive functions and awareness: Secondary | ICD-10-CM | POA: Diagnosis not present

## 2020-11-03 DIAGNOSIS — N1832 Chronic kidney disease, stage 3b: Secondary | ICD-10-CM

## 2020-11-07 ENCOUNTER — Encounter: Payer: Self-pay | Admitting: *Deleted

## 2020-12-02 ENCOUNTER — Other Ambulatory Visit: Payer: Self-pay

## 2020-12-02 ENCOUNTER — Encounter (HOSPITAL_COMMUNITY): Payer: Self-pay

## 2020-12-02 ENCOUNTER — Emergency Department (HOSPITAL_COMMUNITY): Payer: Medicare Other

## 2020-12-02 ENCOUNTER — Inpatient Hospital Stay (HOSPITAL_COMMUNITY)
Admission: EM | Admit: 2020-12-02 | Discharge: 2020-12-07 | DRG: 871 | Disposition: A | Discharge status: Home Health | Payer: Medicare Other | Source: Skilled Nursing Facility | Attending: Internal Medicine | Admitting: Internal Medicine

## 2020-12-02 DIAGNOSIS — G9341 Metabolic encephalopathy: Secondary | ICD-10-CM | POA: Diagnosis present

## 2020-12-02 DIAGNOSIS — F05 Delirium due to known physiological condition: Secondary | ICD-10-CM | POA: Diagnosis present

## 2020-12-02 DIAGNOSIS — N390 Urinary tract infection, site not specified: Principal | ICD-10-CM | POA: Diagnosis present

## 2020-12-02 DIAGNOSIS — E782 Mixed hyperlipidemia: Secondary | ICD-10-CM | POA: Diagnosis present

## 2020-12-02 DIAGNOSIS — G3183 Dementia with Lewy bodies: Secondary | ICD-10-CM | POA: Diagnosis present

## 2020-12-02 DIAGNOSIS — Z794 Long term (current) use of insulin: Secondary | ICD-10-CM

## 2020-12-02 DIAGNOSIS — N1832 Chronic kidney disease, stage 3b: Secondary | ICD-10-CM | POA: Diagnosis present

## 2020-12-02 DIAGNOSIS — Z9049 Acquired absence of other specified parts of digestive tract: Secondary | ICD-10-CM

## 2020-12-02 DIAGNOSIS — Z20822 Contact with and (suspected) exposure to covid-19: Secondary | ICD-10-CM | POA: Diagnosis present

## 2020-12-02 DIAGNOSIS — E1169 Type 2 diabetes mellitus with other specified complication: Secondary | ICD-10-CM | POA: Diagnosis present

## 2020-12-02 DIAGNOSIS — Z951 Presence of aortocoronary bypass graft: Secondary | ICD-10-CM

## 2020-12-02 DIAGNOSIS — I2510 Atherosclerotic heart disease of native coronary artery without angina pectoris: Secondary | ICD-10-CM

## 2020-12-02 DIAGNOSIS — Z139 Encounter for screening, unspecified: Secondary | ICD-10-CM

## 2020-12-02 DIAGNOSIS — Z79899 Other long term (current) drug therapy: Secondary | ICD-10-CM

## 2020-12-02 DIAGNOSIS — Z87891 Personal history of nicotine dependence: Secondary | ICD-10-CM

## 2020-12-02 DIAGNOSIS — E1142 Type 2 diabetes mellitus with diabetic polyneuropathy: Secondary | ICD-10-CM | POA: Diagnosis present

## 2020-12-02 DIAGNOSIS — I129 Hypertensive chronic kidney disease with stage 1 through stage 4 chronic kidney disease, or unspecified chronic kidney disease: Secondary | ICD-10-CM | POA: Diagnosis present

## 2020-12-02 DIAGNOSIS — I252 Old myocardial infarction: Secondary | ICD-10-CM

## 2020-12-02 DIAGNOSIS — E7800 Pure hypercholesterolemia, unspecified: Secondary | ICD-10-CM | POA: Diagnosis present

## 2020-12-02 DIAGNOSIS — A4151 Sepsis due to Escherichia coli [E. coli]: Secondary | ICD-10-CM | POA: Diagnosis not present

## 2020-12-02 DIAGNOSIS — F0280 Dementia in other diseases classified elsewhere without behavioral disturbance: Secondary | ICD-10-CM | POA: Diagnosis present

## 2020-12-02 DIAGNOSIS — F0390 Unspecified dementia without behavioral disturbance: Secondary | ICD-10-CM | POA: Diagnosis present

## 2020-12-02 DIAGNOSIS — Z7982 Long term (current) use of aspirin: Secondary | ICD-10-CM

## 2020-12-02 DIAGNOSIS — K219 Gastro-esophageal reflux disease without esophagitis: Secondary | ICD-10-CM | POA: Diagnosis present

## 2020-12-02 DIAGNOSIS — A419 Sepsis, unspecified organism: Secondary | ICD-10-CM | POA: Diagnosis present

## 2020-12-02 DIAGNOSIS — N179 Acute kidney failure, unspecified: Secondary | ICD-10-CM | POA: Diagnosis present

## 2020-12-02 DIAGNOSIS — E1122 Type 2 diabetes mellitus with diabetic chronic kidney disease: Secondary | ICD-10-CM | POA: Diagnosis present

## 2020-12-02 LAB — COMPREHENSIVE METABOLIC PANEL WITH GFR
ALT: 33 U/L (ref 0–44)
AST: 21 U/L (ref 15–41)
Albumin: 3.8 g/dL (ref 3.5–5.0)
Alkaline Phosphatase: 25 U/L — ABNORMAL LOW (ref 38–126)
Anion gap: 8 (ref 5–15)
BUN: 34 mg/dL — ABNORMAL HIGH (ref 8–23)
CO2: 25 mmol/L (ref 22–32)
Calcium: 8.9 mg/dL (ref 8.9–10.3)
Chloride: 103 mmol/L (ref 98–111)
Creatinine, Ser: 2.8 mg/dL — ABNORMAL HIGH (ref 0.61–1.24)
GFR, Estimated: 24 mL/min — ABNORMAL LOW
Glucose, Bld: 153 mg/dL — ABNORMAL HIGH (ref 70–99)
Potassium: 3.5 mmol/L (ref 3.5–5.1)
Sodium: 136 mmol/L (ref 135–145)
Total Bilirubin: 1.1 mg/dL (ref 0.3–1.2)
Total Protein: 6.2 g/dL — ABNORMAL LOW (ref 6.5–8.1)

## 2020-12-02 LAB — CBC WITH DIFFERENTIAL/PLATELET
Abs Immature Granulocytes: 0.23 K/uL — ABNORMAL HIGH (ref 0.00–0.07)
Basophils Absolute: 0 K/uL (ref 0.0–0.1)
Basophils Relative: 0 %
Eosinophils Absolute: 0 K/uL (ref 0.0–0.5)
Eosinophils Relative: 0 %
HCT: 31.4 % — ABNORMAL LOW (ref 39.0–52.0)
Hemoglobin: 10.6 g/dL — ABNORMAL LOW (ref 13.0–17.0)
Immature Granulocytes: 2 %
Lymphocytes Relative: 6 %
Lymphs Abs: 0.9 K/uL (ref 0.7–4.0)
MCH: 30.4 pg (ref 26.0–34.0)
MCHC: 33.8 g/dL (ref 30.0–36.0)
MCV: 90 fL (ref 80.0–100.0)
Monocytes Absolute: 1 K/uL (ref 0.1–1.0)
Monocytes Relative: 7 %
Neutro Abs: 13.3 K/uL — ABNORMAL HIGH (ref 1.7–7.7)
Neutrophils Relative %: 85 %
Platelets: 147 K/uL — ABNORMAL LOW (ref 150–400)
RBC: 3.49 MIL/uL — ABNORMAL LOW (ref 4.22–5.81)
RDW: 15.4 % (ref 11.5–15.5)
WBC: 15.5 K/uL — ABNORMAL HIGH (ref 4.0–10.5)
nRBC: 0 % (ref 0.0–0.2)

## 2020-12-02 LAB — RESP PANEL BY RT-PCR (FLU A&B, COVID) ARPGX2
Influenza A by PCR: NEGATIVE
Influenza B by PCR: NEGATIVE
SARS Coronavirus 2 by RT PCR: NEGATIVE

## 2020-12-02 LAB — URINALYSIS, ROUTINE W REFLEX MICROSCOPIC
Bilirubin Urine: NEGATIVE
Glucose, UA: 50 mg/dL — AB
Ketones, ur: 5 mg/dL — AB
Nitrite: NEGATIVE
Protein, ur: 100 mg/dL — AB
Specific Gravity, Urine: 1.019 (ref 1.005–1.030)
WBC, UA: >50 WBC/hpf — ABNORMAL HIGH (ref 0–5)
pH: 5 (ref 5.0–8.0)

## 2020-12-02 LAB — PROTIME-INR
INR: 1 (ref 0.8–1.2)
Prothrombin Time: 13 seconds (ref 11.4–15.2)

## 2020-12-02 LAB — APTT: aPTT: 23 seconds — ABNORMAL LOW (ref 24–36)

## 2020-12-02 LAB — LACTIC ACID, PLASMA: Lactic Acid, Venous: 1.8 mmol/L (ref 0.5–1.9)

## 2020-12-02 MED ORDER — SODIUM CHLORIDE 0.9 % IV BOLUS
500.0000 mL | Freq: Once | INTRAVENOUS | Status: AC
  Administered 2020-12-02: 500 mL via INTRAVENOUS

## 2020-12-02 MED ORDER — SODIUM CHLORIDE 0.9 % IV SOLN
1.0000 g | Freq: Once | INTRAVENOUS | Status: AC
  Administered 2020-12-02: 1 g via INTRAVENOUS
  Filled 2020-12-02: qty 10

## 2020-12-02 MED ORDER — ACETAMINOPHEN 650 MG RE SUPP
650.0000 mg | Freq: Once | RECTAL | Status: AC
  Administered 2020-12-02: 650 mg via RECTAL
  Filled 2020-12-02: qty 1

## 2020-12-02 NOTE — Sepsis Progress Note (Signed)
Monitoring for code sepsis protocol. 

## 2020-12-02 NOTE — ED Provider Notes (Signed)
[facility]-EMERGENCY DEPT Provider Note   CSN: [id number] Arrival date & time: 12/02/20  2038     History Chief Complaint  Patient presents with   Altered Mental Status   Weakness    Anthony Morse is a 70 y.o. male.  The history is provided by the patient and medical records.  Altered Mental Status Associated symptoms: weakness   Weakness [name] is a 70 y.o. male who presents to the Emergency Department complaining of altered mental status. Level V caveat due to altered mental status. History is provided by EMS and the patient. He is a resident at [facility] independent living. EMS was called for altered mental status, weakness and concern for UTI. He does have a history of dementia. Patient denies any complaints on ED evaluation.  Additional hx available from daughter after patient's initial ED arrival.  More confused today, more fatigued.  Today he was wandering around the ILF and he was more shakey today.  No N/V, cough.       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, new Dx, on Prednisone   Sleep apnea     Patient Active Problem List   Diagnosis Date Noted   Cognitive decline 10/25/2020   Hyperlipidemia 04/22/2020   Coronary artery disease 10/22/2017    Past Surgical History:  Procedure Laterality Date   APPENDECTOMY     CHOLECYSTECTOMY         Family History  Problem Relation Age of Onset   Dementia Neg Hx     Social History   Tobacco Use   Smoking status: Former   Smokeless tobacco: Never  Building services engineer Use: Never used  Substance Use Topics   Alcohol use: Not Currently   Drug use: Not Currently    Home Medications Prior to Admission medications   Medication Sig Start Date End Date Taking?  Authorizing Provider  Alirocumab (PRALUENT) 75 MG/ML SOAJ Inject 1 pen into the skin every 14 (fourteen) days. 10/25/20   Pricilla Riffle, MD  amoxicillin-clavulanate (AUGMENTIN) 875-125 MG tablet Take 1 tablet by mouth every 12 (twelve) hours. 10/23/20   Anthony Kuster, MD  aspirin EC 81 MG tablet Take 81 mg by mouth daily. Swallow whole.    [provider]  calcium carbonate (TUMS - DOSED IN MG ELEMENTAL CALCIUM) 500 MG chewable tablet Chew 500 mg by mouth daily as needed for indigestion or heartburn.    [provider]  Continuous Blood Gluc Sensor (FREESTYLE LIBRE 14 DAY SENSOR) MISC Apply topically as directed. 09/27/20   [provider]  fenofibrate micronized (ANTARA) 130 MG capsule Take 130 mg by mouth daily before breakfast.    [provider]  furosemide (LASIX) 40 MG tablet Take 40 mg by mouth.    [provider]  gabapentin (NEURONTIN) 100 MG capsule Take 100 mg by mouth 2 (two) times daily.    [provider]  insulin aspart protamine- aspart (NOVOLOG MIX 70/30) (70-30) 100 UNIT/ML injection Inject 40 Units into the skin 2 (two) times daily as needed (blood sugar over 150).    [provider]  losartan (COZAAR) 25 MG tablet Take 25 mg by mouth daily. 09/16/20  [provider]  midodrine (PROAMATINE) 5 MG tablet Take 5 mg by mouth 2 (two) times daily.    [provider]  niacin 500 MG tablet Take 500 mg by mouth at bedtime.    [provider]  omeprazole (PRILOSEC) 20 MG capsule Take 1 capsule by mouth daily. 09/01/20   [provider]  predniSONE (DELTASONE) 10 MG tablet 30 tablets.    [provider]  rosuvastatin (CRESTOR) 40 MG tablet Take 1 tablet (40 mg total) by mouth daily. 06/22/20   Pricilla Riffle, MD    Allergies    Tramadol  Review of Systems   Review of Systems  Neurological:  Positive for weakness.  All other systems reviewed and are negative.  Physical Exam Updated  Vital Signs BP 130/77   Pulse (!) 107   Temp (!) 101.9 F (38.8 C) (Rectal)   Resp (!) 26   Ht 5\' 11"  (1.803 m)   Wt 95.3 kg   SpO2 98%   BMI 29.30 kg/m   Physical Exam Vitals and nursing note reviewed.  Constitutional:      Appearance: He is well-developed.  HENT:     Head: Normocephalic and atraumatic.  Cardiovascular:     Rate and Rhythm: Regular rhythm. Tachycardia present.     Heart sounds: No murmur heard. Pulmonary:     Effort: Pulmonary effort is normal. No respiratory distress.  Abdominal:     Palpations: Abdomen is soft.     Tenderness: There is no abdominal tenderness. There is no guarding or rebound.  Musculoskeletal:        General: No tenderness.  Skin:    General: Skin is warm and dry.  Neurological:     Mental Status: He is alert.     Comments: Confused. Disoriented to place in time. Generalized weakness.  Psychiatric:        Behavior: Behavior normal.    ED Results / Procedures / Treatments   Labs (all labs ordered are listed, but only abnormal results are displayed) Labs Reviewed  COMPREHENSIVE METABOLIC PANEL - Abnormal; Notable for the following components:      Result Value   Glucose, Bld 153 (*)    BUN 34 (*)    Creatinine, Ser 2.80 (*)    Total Protein 6.2 (*)    Alkaline Phosphatase 25 (*)    GFR, Estimated 24 (*)    All other components within normal limits  CBC WITH DIFFERENTIAL/PLATELET - Abnormal; Notable for the following components:   WBC 15.5 (*)    RBC 3.49 (*)    Hemoglobin 10.6 (*)    HCT 31.4 (*)    Platelets 147 (*)    Neutro Abs 13.3 (*)    Abs Immature Granulocytes 0.23 (*)    All other components within normal limits  APTT - Abnormal; Notable for the following components:   aPTT 23 (*)    All other components within normal limits  URINALYSIS, ROUTINE W REFLEX MICROSCOPIC - Abnormal; Notable for the following components:   APPearance CLOUDY (*)    Glucose, UA 50 (*)    Hgb urine dipstick LARGE (*)    Ketones, ur 5  (*)    Protein, ur 100 (*)    Leukocytes,Ua LARGE (*)    WBC, UA >50 (*)    Bacteria, UA MANY (*)    All other components within normal limits  CULTURE, BLOOD (SINGLE)  URINE CULTURE  RESP PANEL BY RT-PCR (FLU A&B, COVID) ARPGX2  LACTIC  ACID, PLASMA  PROTIME-INR  LACTIC ACID, PLASMA    EKG EKG Interpretation  Date/Time:  Friday December 02 2020 21:11:29 EDT Ventricular Rate:  109 PR Interval:  167 QRS Duration: 137 QT Interval:  340 QTC Calculation: 458 R Axis:   -9 Text Interpretation: Sinus tachycardia Multiple ventricular premature complexes Right bundle branch block Confirmed by Tilden Fossa [phone]) on 12/02/2020 10:37:11 PM  Radiology DG Chest Port 1 View  Result Date: 12/02/2020 CLINICAL DATA:  Weakness altered mental status EXAM: PORTABLE CHEST 1 VIEW COMPARISON:  CT 10/03/2020 FINDINGS: Low lung volumes. Post sternotomy changes. Subsegmental atelectasis left base. Exaggerated cardiomediastinal silhouette by low lung volume. Aortic atherosclerosis. No pneumothorax IMPRESSION: No active disease.  Low lung volumes Electronically Signed   By: Jasmine Pang M.D.   On: 12/02/2020 21:36    Procedures Procedures  CRITICAL CARE Performed by: Tilden Fossa   Total critical care time: 35 minutes  Critical care time was exclusive of separately billable procedures and treating other patients.  Critical care was necessary to treat or prevent imminent or life-threatening deterioration.  Critical care was time spent personally by me on the following activities: development of treatment plan with patient and/or surrogate as well as nursing, discussions with consultants, evaluation of patient's response to treatment, examination of patient, obtaining history from patient or surrogate, ordering and performing treatments and interventions, ordering and review of laboratory studies, ordering and review of radiographic studies, pulse oximetry and re-evaluation of patient's  condition.   Medications Ordered in ED Medications  acetaminophen (TYLENOL) suppository 650 mg (650 mg Rectal Given 12/02/20 2135)  sodium chloride 0.9 % bolus 500 mL (500 mLs Intravenous New Bag/Given 12/02/20 2206)  cefTRIAXone (ROCEPHIN) 1 g in sodium chloride 0.9 % 100 mL IVPB (1 g Intravenous New Bag/Given 12/02/20 2207)    ED Course  I have reviewed the triage vital signs and the nursing notes.  Pertinent labs & imaging results that were available during my care of the patient were reviewed by me and considered in my medical decision making (see chart for details).    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 He seems altered compared to his baseline starting today. Daughter does report urinary frequency since yesterday. UA is concerning for UTI, CBC with leukocytosis. He is febrile and tachycardic on ED presentation. Code sepsis was initiated he was treated with IV fluids, antibiotics. Plan to admit for ongoing treatment.  Final Clinical Impression(s) / ED Diagnoses Final diagnoses:  Acute UTI  Sepsis with encephalopathy without septic shock, due to unspecified organism [facility])    Rx / DC Orders ED Discharge Orders     None        Tilden Fossa, MD 12/02/20 2306

## 2020-12-02 NOTE — ED Triage Notes (Signed)
Per EMS, pt picked up from [facility] (independent living side) for c/o weakness and increased altered mental status due to poss UTI. Pt with hx of dementia.

## 2020-12-03 ENCOUNTER — Inpatient Hospital Stay (HOSPITAL_COMMUNITY): Payer: Medicare Other

## 2020-12-03 ENCOUNTER — Encounter (HOSPITAL_COMMUNITY): Payer: Self-pay | Admitting: Internal Medicine

## 2020-12-03 DIAGNOSIS — N390 Urinary tract infection, site not specified: Secondary | ICD-10-CM | POA: Diagnosis not present

## 2020-12-03 DIAGNOSIS — E782 Mixed hyperlipidemia: Secondary | ICD-10-CM | POA: Diagnosis present

## 2020-12-03 DIAGNOSIS — I2510 Atherosclerotic heart disease of native coronary artery without angina pectoris: Secondary | ICD-10-CM | POA: Diagnosis present

## 2020-12-03 DIAGNOSIS — Z20822 Contact with and (suspected) exposure to covid-19: Secondary | ICD-10-CM | POA: Diagnosis present

## 2020-12-03 DIAGNOSIS — N179 Acute kidney failure, unspecified: Secondary | ICD-10-CM | POA: Diagnosis present

## 2020-12-03 DIAGNOSIS — N1832 Chronic kidney disease, stage 3b: Secondary | ICD-10-CM | POA: Diagnosis present

## 2020-12-03 DIAGNOSIS — E7800 Pure hypercholesterolemia, unspecified: Secondary | ICD-10-CM | POA: Diagnosis present

## 2020-12-03 DIAGNOSIS — F0280 Dementia in other diseases classified elsewhere without behavioral disturbance: Secondary | ICD-10-CM | POA: Diagnosis present

## 2020-12-03 DIAGNOSIS — G3183 Dementia with Lewy bodies: Secondary | ICD-10-CM | POA: Diagnosis present

## 2020-12-03 DIAGNOSIS — A419 Sepsis, unspecified organism: Secondary | ICD-10-CM

## 2020-12-03 DIAGNOSIS — Z87891 Personal history of nicotine dependence: Secondary | ICD-10-CM | POA: Diagnosis not present

## 2020-12-03 DIAGNOSIS — K219 Gastro-esophageal reflux disease without esophagitis: Secondary | ICD-10-CM

## 2020-12-03 DIAGNOSIS — Z79899 Other long term (current) drug therapy: Secondary | ICD-10-CM | POA: Diagnosis not present

## 2020-12-03 DIAGNOSIS — Z951 Presence of aortocoronary bypass graft: Secondary | ICD-10-CM | POA: Diagnosis not present

## 2020-12-03 DIAGNOSIS — E1142 Type 2 diabetes mellitus with diabetic polyneuropathy: Secondary | ICD-10-CM

## 2020-12-03 DIAGNOSIS — Z794 Long term (current) use of insulin: Secondary | ICD-10-CM

## 2020-12-03 DIAGNOSIS — F05 Delirium due to known physiological condition: Secondary | ICD-10-CM | POA: Diagnosis present

## 2020-12-03 DIAGNOSIS — E1122 Type 2 diabetes mellitus with diabetic chronic kidney disease: Secondary | ICD-10-CM

## 2020-12-03 DIAGNOSIS — A4151 Sepsis due to Escherichia coli [E. coli]: Secondary | ICD-10-CM | POA: Diagnosis present

## 2020-12-03 DIAGNOSIS — I129 Hypertensive chronic kidney disease with stage 1 through stage 4 chronic kidney disease, or unspecified chronic kidney disease: Secondary | ICD-10-CM | POA: Diagnosis present

## 2020-12-03 DIAGNOSIS — E1169 Type 2 diabetes mellitus with other specified complication: Secondary | ICD-10-CM

## 2020-12-03 DIAGNOSIS — Z9049 Acquired absence of other specified parts of digestive tract: Secondary | ICD-10-CM | POA: Diagnosis not present

## 2020-12-03 DIAGNOSIS — Z7982 Long term (current) use of aspirin: Secondary | ICD-10-CM | POA: Diagnosis not present

## 2020-12-03 DIAGNOSIS — G9341 Metabolic encephalopathy: Secondary | ICD-10-CM | POA: Diagnosis not present

## 2020-12-03 DIAGNOSIS — I252 Old myocardial infarction: Secondary | ICD-10-CM | POA: Diagnosis not present

## 2020-12-03 HISTORY — DX: Gastro-esophageal reflux disease without esophagitis: K21.9

## 2020-12-03 HISTORY — DX: Long term (current) use of insulin: Z79.4

## 2020-12-03 HISTORY — DX: Chronic kidney disease, stage 3b: N18.32

## 2020-12-03 LAB — COMPREHENSIVE METABOLIC PANEL
ALT: 28 U/L (ref 0–44)
AST: 18 U/L (ref 15–41)
Albumin: 3.5 g/dL (ref 3.5–5.0)
Alkaline Phosphatase: 25 U/L — ABNORMAL LOW (ref 38–126)
Anion gap: 7 (ref 5–15)
BUN: 29 mg/dL — ABNORMAL HIGH (ref 8–23)
CO2: 26 mmol/L (ref 22–32)
Calcium: 8.8 mg/dL — ABNORMAL LOW (ref 8.9–10.3)
Chloride: 105 mmol/L (ref 98–111)
Creatinine, Ser: 2.22 mg/dL — ABNORMAL HIGH (ref 0.61–1.24)
GFR, Estimated: 32 mL/min — ABNORMAL LOW (ref >60)
Glucose, Bld: 153 mg/dL — ABNORMAL HIGH (ref 70–99)
Potassium: 3.8 mmol/L (ref 3.5–5.1)
Sodium: 138 mmol/L (ref 135–145)
Total Bilirubin: 1.3 mg/dL — ABNORMAL HIGH (ref 0.3–1.2)
Total Protein: 5.9 g/dL — ABNORMAL LOW (ref 6.5–8.1)

## 2020-12-03 LAB — GLUCOSE, CAPILLARY
Glucose-Capillary: 96 mg/dL (ref 70–99)
Glucose-Capillary: 106 mg/dL — ABNORMAL HIGH (ref 70–99)

## 2020-12-03 LAB — CBC WITH DIFFERENTIAL/PLATELET
Abs Immature Granulocytes: 0.14 10*3/uL — ABNORMAL HIGH (ref 0.00–0.07)
Basophils Absolute: 0 10*3/uL (ref 0.0–0.1)
Basophils Relative: 0 %
Eosinophils Absolute: 0 10*3/uL (ref 0.0–0.5)
Eosinophils Relative: 0 %
HCT: 30.5 % — ABNORMAL LOW (ref 39.0–52.0)
Hemoglobin: 10.2 g/dL — ABNORMAL LOW (ref 13.0–17.0)
Immature Granulocytes: 1 %
Lymphocytes Relative: 10 %
Lymphs Abs: 1.5 10*3/uL (ref 0.7–4.0)
MCH: 30.2 pg (ref 26.0–34.0)
MCHC: 33.4 g/dL (ref 30.0–36.0)
MCV: 90.2 fL (ref 80.0–100.0)
Monocytes Absolute: 0.9 10*3/uL (ref 0.1–1.0)
Monocytes Relative: 6 %
Neutro Abs: 12.8 10*3/uL — ABNORMAL HIGH (ref 1.7–7.7)
Neutrophils Relative %: 83 %
Platelets: 130 10*3/uL — ABNORMAL LOW (ref 150–400)
RBC: 3.38 MIL/uL — ABNORMAL LOW (ref 4.22–5.81)
RDW: 15.3 % (ref 11.5–15.5)
WBC: 15.3 10*3/uL — ABNORMAL HIGH (ref 4.0–10.5)
nRBC: 0 % (ref 0.0–0.2)

## 2020-12-03 LAB — CORTISOL-AM, BLOOD: Cortisol - AM: 20.7 ug/dL (ref 6.7–22.6)

## 2020-12-03 LAB — CBG MONITORING, ED
Glucose-Capillary: 181 mg/dL — ABNORMAL HIGH (ref 70–99)
Glucose-Capillary: 76 mg/dL (ref 70–99)

## 2020-12-03 LAB — PROCALCITONIN: Procalcitonin: 1.07 ng/mL

## 2020-12-03 LAB — PROTIME-INR
INR: 1 (ref 0.8–1.2)
Prothrombin Time: 13.4 seconds (ref 11.4–15.2)

## 2020-12-03 LAB — HIV ANTIBODY (ROUTINE TESTING W REFLEX): HIV Screen 4th Generation wRfx: NONREACTIVE

## 2020-12-03 LAB — HEMOGLOBIN A1C
Hgb A1c MFr Bld: 11.4 % — ABNORMAL HIGH (ref 4.8–5.6)
Mean Plasma Glucose: 280.48 mg/dL

## 2020-12-03 LAB — MAGNESIUM: Magnesium: 2.1 mg/dL (ref 1.7–2.4)

## 2020-12-03 MED ORDER — INSULIN ASPART PROT & ASPART (70-30 MIX) 100 UNIT/ML ~~LOC~~ SUSP
20.0000 [IU] | Freq: Two times a day (BID) | SUBCUTANEOUS | Status: DC
  Administered 2020-12-03 – 2020-12-07 (×8): 20 [IU] via SUBCUTANEOUS
  Filled 2020-12-03 (×2): qty 10

## 2020-12-03 MED ORDER — SODIUM CHLORIDE 0.9 % IV BOLUS (SEPSIS)
1000.0000 mL | Freq: Once | INTRAVENOUS | Status: AC
  Administered 2020-12-03: 1000 mL via INTRAVENOUS

## 2020-12-03 MED ORDER — PREDNISONE 5 MG PO TABS
10.0000 mg | ORAL_TABLET | Freq: Every day | ORAL | Status: DC
  Administered 2020-12-03 – 2020-12-07 (×5): 10 mg via ORAL
  Filled 2020-12-03: qty 1
  Filled 2020-12-03 (×4): qty 2

## 2020-12-03 MED ORDER — SODIUM CHLORIDE 0.9 % IV SOLN
1.0000 g | INTRAVENOUS | Status: DC
  Administered 2020-12-03 – 2020-12-04 (×2): 1 g via INTRAVENOUS
  Filled 2020-12-03 (×2): qty 1

## 2020-12-03 MED ORDER — ONDANSETRON HCL 4 MG PO TABS: 4.0000 mg | ORAL_TABLET | Freq: Four times a day (QID) | ORAL | Status: DC | PRN

## 2020-12-03 MED ORDER — INSULIN ASPART 100 UNIT/ML IJ SOLN
0.0000 [IU] | Freq: Three times a day (TID) | INTRAMUSCULAR | Status: DC
  Administered 2020-12-03 – 2020-12-05 (×2): 3 [IU] via SUBCUTANEOUS
  Administered 2020-12-05 – 2020-12-06 (×2): 2 [IU] via SUBCUTANEOUS
  Administered 2020-12-06: 3 [IU] via SUBCUTANEOUS
  Filled 2020-12-03: qty 0.15

## 2020-12-03 MED ORDER — ONDANSETRON HCL 4 MG/2ML IJ SOLN: 4.0000 mg | Freq: Four times a day (QID) | INTRAMUSCULAR | Status: DC | PRN

## 2020-12-03 MED ORDER — SODIUM CHLORIDE 0.9 % IV SOLN: INTRAVENOUS | Status: DC

## 2020-12-03 MED ORDER — HALOPERIDOL LACTATE 5 MG/ML IJ SOLN
2.5000 mg | Freq: Four times a day (QID) | INTRAMUSCULAR | Status: DC | PRN
  Administered 2020-12-03 – 2020-12-05 (×8): 2.5 mg via INTRAMUSCULAR
  Filled 2020-12-03 (×8): qty 1

## 2020-12-03 MED ORDER — POLYETHYLENE GLYCOL 3350 17 G PO PACK: 17.0000 g | PACK | Freq: Every day | ORAL | Status: DC | PRN

## 2020-12-03 MED ORDER — ROSUVASTATIN CALCIUM 20 MG PO TABS
40.0000 mg | ORAL_TABLET | Freq: Every day | ORAL | Status: DC
  Administered 2020-12-03 – 2020-12-07 (×5): 40 mg via ORAL
  Filled 2020-12-03 (×5): qty 2

## 2020-12-03 MED ORDER — ACETAMINOPHEN 325 MG PO TABS
650.0000 mg | ORAL_TABLET | Freq: Four times a day (QID) | ORAL | Status: DC | PRN
  Administered 2020-12-04 – 2020-12-06 (×4): 650 mg via ORAL
  Filled 2020-12-03 (×4): qty 2

## 2020-12-03 MED ORDER — ACETAMINOPHEN 650 MG RE SUPP: 650.0000 mg | Freq: Four times a day (QID) | RECTAL | Status: DC | PRN

## 2020-12-03 MED ORDER — ENOXAPARIN SODIUM 30 MG/0.3ML IJ SOSY
30.0000 mg | PREFILLED_SYRINGE | INTRAMUSCULAR | Status: DC
  Administered 2020-12-03 – 2020-12-05 (×3): 30 mg via SUBCUTANEOUS
  Filled 2020-12-03 (×3): qty 0.3

## 2020-12-03 MED ORDER — PANTOPRAZOLE SODIUM 40 MG PO TBEC
40.0000 mg | DELAYED_RELEASE_TABLET | Freq: Every day | ORAL | Status: DC
  Administered 2020-12-03 – 2020-12-07 (×5): 40 mg via ORAL
  Filled 2020-12-03 (×5): qty 1

## 2020-12-03 MED ORDER — MIDODRINE HCL 5 MG PO TABS
5.0000 mg | ORAL_TABLET | Freq: Two times a day (BID) | ORAL | Status: DC
  Administered 2020-12-03 – 2020-12-07 (×10): 5 mg via ORAL
  Filled 2020-12-03 (×10): qty 1

## 2020-12-03 NOTE — ED Notes (Signed)
Pt hard to redirect.  Refused breakfast tray.  Comfort measures in place and bed alarm turned on.

## 2020-12-03 NOTE — ED Notes (Signed)
Patient was climbing out of bed. Bed alarm going off notifying staff. Patient was confused and had pulled his IV out. He also pulled off his BP cuff and O2 monitor. MD notified and new orders were put in.

## 2020-12-03 NOTE — H&P (Signed)
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  Patient is an extremely poor historian due to underlying encephalopathy in the setting of dementia.  Majority of the history has been obtained from the emergency department provider.  Attempts attempted to contact the daughter via phone call have been unsuccessful.  For the past several days the patient has been exhibiting increasing lethargy and weakness while at his facility.  Over the same span of time the patient is also been experiencing increasing urinary frequency.  The patient is attempted to ambulate he is appeared to be quite weak and unsteady on his feet.  Patient has not exhibited any vomiting or abdominal pain or diarrhea.  As   Due to patient's progressively worsening symptoms the patient was eventually brought via EMS to [facility] emergency department for evaluation.  Upon evaluation in the emergency department patient is been found to exhibit multiple SIRS criteria including a fever on arrival of 101.9 F.  Urinalysis was found to be suggestive of a urinary tract infection with evidence of early acute kidney injury on chemistry and encephalopathy on exam all concerning for sepsis.  Patient is initiated on intravenous ceftriaxone as  well as given a 500 cc normal saline bolus.  The hospitalist group was then called to assess the patient for admission to the hospital.  Review of Systems:   Review of Systems  Unable to perform ROS: Mental status change  All other systems reviewed and are negative.  Past Medical History:  Diagnosis Date   Chronic kidney disease, stage 3b (HCC) 12/03/2020   Coronary artery disease involving native heart without angina pectoris 10/22/2017   Formatting of this note might be different from the original. 2012 CABGx5 [facility]   Dementia [facility])    DM (diabetes mellitus) (HCC)    Edema leg    Gastroesophageal reflux disease without esophagitis 12/03/2020   High cholesterol    History of MI (myocardial infarction)    Hypertension    Kidney disease    Minimal change disease    per daughter's report, new Dx, on Prednisone   Myocardial infarction involving left anterior descending (LAD) coronary artery (HCC) 10/22/2017   Sleep apnea    Type 2 diabetes mellitus with stage 3b chronic kidney disease, with long-term current use of insulin (HCC) 12/03/2020    Past Surgical History:  Procedure Laterality Date   APPENDECTOMY     CHOLECYSTECTOMY       reports that he has quit smoking. He has never used smokeless tobacco. He reports previous alcohol use. He reports previous drug use.  Allergies  Allergen Reactions   Tramadol     Delusions (intolerance), Hallucinations,    Family History  Problem Relation Age of Onset   Dementia Neg Hx      Prior to Admission medications   Medication Sig Start Date End Date Taking? Authorizing  Provider  Alirocumab (PRALUENT) 75 MG/ML SOAJ Inject 1 pen into the skin every 14 (fourteen) days. 10/25/20   Pricilla Riffleoss, Paula V, MD  amoxicillin-clavulanate (AUGMENTIN) 875-125 MG tablet Take 1 tablet by mouth every 12 (twelve) hours. 10/23/20   Frederica KusterMiller, Stephen M, MD  aspirin EC 81 MG tablet Take 81 mg by mouth daily. Swallow whole.    [provider]  calcium  carbonate (TUMS - DOSED IN MG ELEMENTAL CALCIUM) 500 MG chewable tablet Chew 500 mg by mouth daily as needed for indigestion or heartburn.    [provider]  Continuous Blood Gluc Sensor (FREESTYLE LIBRE 14 DAY SENSOR) MISC Apply topically as directed. 09/27/20   [provider]  fenofibrate micronized (ANTARA) 130 MG capsule Take 130 mg by mouth daily before breakfast.    [provider]  furosemide (LASIX) 40 MG tablet Take 40 mg by mouth.    [provider]  gabapentin (NEURONTIN) 100 MG capsule Take 100 mg by mouth 2 (two) times daily.    [provider]  insulin aspart protamine- aspart (NOVOLOG MIX 70/30) (70-30) 100 UNIT/ML injection Inject 40 Units into the skin 2 (two) times daily as needed (blood sugar over 150).    [provider]  losartan (COZAAR) 25 MG tablet Take 25 mg by mouth daily. 09/16/20   [provider]  midodrine (PROAMATINE) 5 MG tablet Take 5 mg by mouth 2 (two) times daily.    [provider]  niacin 500 MG tablet Take 500 mg by mouth at bedtime.    [provider]  omeprazole (PRILOSEC) 20 MG capsule Take 1 capsule by mouth daily. 09/01/20   [provider]  predniSONE (DELTASONE) 10 MG tablet 30 tablets.    [provider]  rosuvastatin (CRESTOR) 40 MG tablet Take 1 tablet (40 mg total) by mouth daily. 06/22/20   Pricilla Riffleoss, Paula V, MD    Physical Exam: Vitals:   12/03/20 0030 12/03/20 0100 12/03/20 0130 12/03/20 0200  BP: 136/84 120/77 128/78 140/62  Pulse: 96 97 95 (!) 102  Resp: (!) 26 (!) 22 (!) 21 15  Temp:      TempSrc:      SpO2: 96% 96% 95% 97%  Weight:      Height:        Constitutional: Lethargic but arousable, oriented x2.  Patient is currently agitated  Skin: no rashes, no lesions, extremely poor skin turgor noted. Eyes: Pupils are equally reactive to light.  No evidence of scleral icterus or conjunctival pallor.  ENMT: Dry mucous membranes noted.  Posterior  pharynx clear of any exudate or lesions.   Neck: normal, supple, no masses, no thyromegaly.  No evidence of jugular venous distension.   Respiratory: clear to auscultation bilaterally, no wheezing, no crackles. Normal respiratory effort. No accessory muscle use.  Cardiovascular: Tachycardic rate and regular rhythm, no murmurs / rubs / gallops. No extremity edema. 2+ pedal pulses. No carotid bruits.  Chest:   Nontender without crepitus or deformity.   Back:   Nontender without crepitus or deformity. Abdomen: Abdomen is soft and nontender.  No evidence of intra-abdominal masses.  Positive bowel sounds noted in all quadrants.   Musculoskeletal: No joint deformity upper and lower extremities. Good ROM, no contractures. Normal muscle tone.  Neurologic: Patient is lethargic but arousable and oriented x2.  Patient is intermittently following commands.  CN 2-12 grossly intact. Sensation intact.  Patient moving all 4 extremities spontaneously.   Patient is responsive to verbal stimuli.  Psychiatric: Unable to fully assess due to significant lethargy.  Patient currently does not seem to possess insight as to his current situation.     Labs on Admission: I have personally reviewed following labs and imaging studies -   CBC: Recent Labs  Lab 12/02/20 2120  WBC 15.5*  NEUTROABS 13.3*  HGB 10.6*  HCT 31.4*  MCV 90.0  PLT 147*   Basic Metabolic Panel: Recent Labs  Lab 12/02/20 2120  NA 136  K 3.5  CL 103  CO2 25  GLUCOSE 153*  BUN 34*  CREATININE 2.80*  CALCIUM 8.9   GFR: Estimated Creatinine Clearance: 30.2 mL/min (A) (by C-G formula based on SCr of 2.8 mg/dL (H)). Liver Function Tests: Recent Labs  Lab 12/02/20 2120  AST 21  ALT 33  ALKPHOS 25*  BILITOT 1.1  PROT 6.2*  ALBUMIN 3.8   No results for input(s): LIPASE, AMYLASE in the last 168 hours. No results for input(s): AMMONIA in the last 168 hours. Coagulation Profile: Recent Labs  Lab 12/02/20 2120  INR 1.0   Cardiac  Enzymes: No results for input(s): CKTOTAL, CKMB, CKMBINDEX, TROPONINI in the last 168 hours. BNP (last 3 results) No results for input(s): PROBNP in the last 8760 hours. HbA1C: No results for input(s): HGBA1C in the last 72 hours. CBG: No results for input(s): GLUCAP in the last 168 hours. Lipid Profile: No results for input(s): CHOL, HDL, LDLCALC, TRIG, CHOLHDL, LDLDIRECT in the last 72 hours. Thyroid Function Tests: No results for input(s): TSH, T4TOTAL, FREET4, T3FREE, THYROIDAB in the last 72 hours. Anemia Panel: No results for input(s): VITAMINB12, FOLATE, FERRITIN, TIBC, IRON, RETICCTPCT in the last 72 hours. Urine analysis:    Component Value Date/Time   COLORURINE YELLOW 12/02/2020 2118   APPEARANCEUR CLOUDY (A) 12/02/2020 2118   APPEARANCEUR Clear 06/14/2020 1126   LABSPEC 1.019 12/02/2020 2118   PHURINE 5.0 12/02/2020 2118   GLUCOSEU 50 (A) 12/02/2020 2118   HGBUR LARGE (A) 12/02/2020 2118   BILIRUBINUR NEGATIVE 12/02/2020 2118   BILIRUBINUR Negative 06/14/2020 1126   KETONESUR 5 (A) 12/02/2020 2118   PROTEINUR 100 (A) 12/02/2020 2118   NITRITE NEGATIVE 12/02/2020 2118   LEUKOCYTESUR LARGE (A) 12/02/2020 2118    Radiological Exams on Admission - Personally Reviewed: DG Chest Port 1 View  Result Date: 12/02/2020 CLINICAL DATA:  Weakness altered mental status EXAM: PORTABLE CHEST 1 VIEW COMPARISON:  CT 10/03/2020 FINDINGS: Low lung volumes. Post sternotomy changes. Subsegmental atelectasis left base. Exaggerated cardiomediastinal silhouette by low lung volume. Aortic atherosclerosis. No pneumothorax IMPRESSION: No active disease.  Low lung volumes Electronically Signed   By: Jasmine Pang M.D.   On: 12/02/2020 21:36    EKG: Personally reviewed.  Rhythm is sinus tachycardia with heart rate of 109 bpm.  Evidence of right bundle branch block.  No dynamic ST segment changes appreciated.  Assessment/Plan Principal Problem: Sepsis secondary to UTI   Patient presenting  with multiple SIRS criteria including leukocytosis fever and tachycardia in the setting of a complicated urinary tract infection with evidence of ongoing organ dysfunction with acute metabolic encephalopathy and early acute kidney injury all concerning for developing sepsis. Patient has been initiated on intravenous ceftriaxone by the emergency department provider which will be continued this time Hydrating patient aggressively with intravenous isotonic fluids Blood and urine cultures have been obtained Obtaining CT imaging of abdomen and pelvis to evaluate for any evidence of retained kidney or uterine stone that could potentially be infected. Postvoid residual bladder scan reveals  no evidence of urinary retention  Active Problems:   Acute metabolic encephalopathy  Patient exhibiting significant lethargy, confusion with generalized weakness and unsteady gait Symptoms are felt to be secondary to underlying acute metabolic encephalopathy secondary to infection Symptoms should gradually resolve as infection is treated Continue to monitor closely and if encephalopathy fails to resolve with resolving UTI then encephalopathy work-up will be expanded    Coronary artery disease involving native heart without angina pectoris  Continue home regimen of statin therapy Patient is currently chest pain-free Monitoring patient on telemetry    Dementia [facility])  Patient is actively being worked up by neurology in the outpatient setting for suspected dementia of unknown type Presence of dementia complicates assessment of encephalopathy   Acute kidney injury superimposed on chronic kidney disease stage IIIb  Mild acute kidney injury superimposed on chronic kidney disease stage IIIb thought to be prerenal secondary to volume depletion Hydrating patient with intravenous isotonic fluids Strict input and output monitoring Monitoring renal function and electrolytes with serial chemistries Obtaining CT imaging  of the abdomen to evaluate for any evidence of renal or ureteral stone Avoiding nephrotoxic agents if at all possible    Type 2 diabetes mellitus with stage 3b chronic kidney disease, with long-term current use of insulin (HCC)  Home regimen of Novolog 70/30 will be reduced to 20 units twice daily due to tenuous oral intake Accu-Cheks before every meal and nightly with sliding scale insulin Hemoglobin A1c pending    Mixed diabetic hyperlipidemia associated with type 2 diabetes mellitus (HCC)  Continue home regimen of statin therapy    Diabetic polyneuropathy associated with type 2 diabetes mellitus (HCC)  Temporarily holding home regimen of gabapentin    Gastroesophageal reflux disease without esophagitis  Continue home regimen of PPI   Code Status:  Full code Family Communication: I have attempted to contact the daughter via the phone number listed on the facesheet however I have been unsuccessful in reaching her.  ER provider was successful in reaching her however and has updated her on plan of care.  Status is: Inpatient  Remains inpatient appropriate because:Ongoing diagnostic testing needed not appropriate for outpatient work up, IV treatments appropriate due to intensity of illness or inability to take PO, and Inpatient level of care appropriate due to severity of illness  Dispo: The patient is from: ALF              Anticipated d/c is to: ALF              Patient currently is not medically stable to d/c.   Difficult to place patient No        Marinda Elk MD [facility] Pager [phone]  If 7PM-7AM, please contact night-coverage [email or web address] Use universal [place] password for that web site. If you do not have the password, please call the hospital operator.  12/03/2020, 2:55 AM

## 2020-12-03 NOTE — ED Notes (Signed)
Pt seen walking to the restroom.  Upon further assessment, Pt had urinated in the floor and then walked to the restroom.  Pt returned to room by DJ EMT.

## 2020-12-03 NOTE — Progress Notes (Signed)
  PROGRESS NOTE  Patient admitted earlier this morning. See H&P.   Patient is sleeping in bed, awakens and follows commands.  He reports feeling weak in his knees over the past week.  He resides at [facility] independent [facility].  He was febrile with a temperature of 101.9 F on admission, encephalopathic, and found to have a urinary tract infection on admission.  He was started on Rocephin and fluids were also initiated.  He is alert to person, president, year, and location but thinks that the hospital is in [place] and does not know the month or day of the week.  There is some question of dementia which he is being worked up for outpatient.  Sepsis secondary to UTI  Blood cultures and urine culture pending, monitor CBC  Rocephin  IV fluids 125 mL/hr NS   Diabetes  Sliding scale, continue NovoLog 70/30 20 units twice daily  Crestor 40 mg daily  GERD  Protonix 40 mg a day  DVT prophylaxis: Lovenox Full code He would like his daughter updated  Sharlene Dory, DO [facility] 12/03/2020, 3:40 PM  Available via Epic secure chat 7am-7pm After these hours, please refer to coverage provider listed on [email or web address]

## 2020-12-03 NOTE — ED Notes (Signed)
Pt is only alert to self.  He is very unsteady on his feet.

## 2020-12-04 DIAGNOSIS — N390 Urinary tract infection, site not specified: Secondary | ICD-10-CM | POA: Diagnosis not present

## 2020-12-04 DIAGNOSIS — A419 Sepsis, unspecified organism: Secondary | ICD-10-CM | POA: Diagnosis not present

## 2020-12-04 LAB — GLUCOSE, CAPILLARY
Glucose-Capillary: 85 mg/dL (ref 70–99)
Glucose-Capillary: 87 mg/dL (ref 70–99)
Glucose-Capillary: 77 mg/dL (ref 70–99)
Glucose-Capillary: 106 mg/dL — ABNORMAL HIGH (ref 70–99)

## 2020-12-04 LAB — BASIC METABOLIC PANEL
Anion gap: 10 (ref 5–15)
BUN: 24 mg/dL — ABNORMAL HIGH (ref 8–23)
CO2: 23 mmol/L (ref 22–32)
Calcium: 9.2 mg/dL (ref 8.9–10.3)
Chloride: 106 mmol/L (ref 98–111)
Creatinine, Ser: 1.62 mg/dL — ABNORMAL HIGH (ref 0.61–1.24)
GFR, Estimated: 46 mL/min — ABNORMAL LOW (ref >60)
Glucose, Bld: 79 mg/dL (ref 70–99)
Potassium: 3.4 mmol/L — ABNORMAL LOW (ref 3.5–5.1)
Sodium: 139 mmol/L (ref 135–145)

## 2020-12-04 LAB — CBC
HCT: 31.4 % — ABNORMAL LOW (ref 39.0–52.0)
Hemoglobin: 10.5 g/dL — ABNORMAL LOW (ref 13.0–17.0)
MCH: 30.1 pg (ref 26.0–34.0)
MCHC: 33.4 g/dL (ref 30.0–36.0)
MCV: 90 fL (ref 80.0–100.0)
Platelets: 144 10*3/uL — ABNORMAL LOW (ref 150–400)
RBC: 3.49 MIL/uL — ABNORMAL LOW (ref 4.22–5.81)
RDW: 15 % (ref 11.5–15.5)
WBC: 16.8 10*3/uL — ABNORMAL HIGH (ref 4.0–10.5)
nRBC: 0 % (ref 0.0–0.2)

## 2020-12-04 MED ORDER — POTASSIUM CHLORIDE CRYS ER 20 MEQ PO TBCR
30.0000 meq | EXTENDED_RELEASE_TABLET | Freq: Two times a day (BID) | ORAL | Status: AC
  Administered 2020-12-04 (×2): 30 meq via ORAL
  Filled 2020-12-04 (×2): qty 1

## 2020-12-04 NOTE — Progress Notes (Signed)
PROGRESS NOTE    Anthony Morse  MLJ:[id number] DOB: 11-Sep-1950 DOA: 12/02/2020 PCP: Annita Brod, MD     Brief Narrative:  70 year old man with history of being worked up for dementia (suspected Lewy body), CKD 3B, diabetes, hypertension, coronary artery disease, GERD, and OSA on CPAP who presented with weakness and lethargy.  He was found to be septic with a leukocytosis, fever, tachycardia, and tachypnea.  UA suggestive of infection.  He was started on fluids and Rocephin.   New events last 24 hours / Subjective: Respiratory rate is now less than 20.  He is still tachycardic and white count is 16.8.  He is being worked up for dementia outpatient.  He reports poor memory of yesterday when he was admitted but thinks he feels better today.  He is not having any fevers, malaise, chest pain/shortness of breath, or urinary complaints.  Assessment & Plan:   Principal Problem:   Sepsis secondary to UTI (HCC)  Rocephin 1 g daily  IVF's NS 125 mL/hour  Urine culture with greater than 100,000 colony-forming units of E. 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 angina pectoris   Dementia (HCC)  Appears near baseline     Acute metabolic encephalopathy   Improving, near baseline per daughter  Haldol as needed for agitation    Acute on Chronic kidney disease, stage 3b (HCC)  Creatinine 1.62 today, improved from 2.22 yesterday  Monitor BMP  As above    Type 2 diabetes mellitus with stage 3b chronic kidney disease, with long-term current use of insulin (HCC)  SSI    Mixed diabetic hyperlipidemia associated with type 2 diabetes mellitus (HCC)   Diabetic polyneuropathy associated with type 2 diabetes mellitus (HCC)  SSI  70/3020 units twice daily    Gastroesophageal reflux disease without esophagitis  Protonix 40 mg  daily  DVT prophylaxis: Lovenox Code Status: Full Family Communication: Spoke with his daughter [name] today Coming From: [facility] Disposition Plan: [facility] Barriers to Discharge: Clinical improvement  Consultants:  None  Procedures:  None  Antimicrobials:  Anti-infectives (From admission, onward)    Start     Dose/Rate Route Frequency Ordered Stop   12/03/20 2200  cefTRIAXone (ROCEPHIN) 1 g in sodium chloride 0.9 % 100 mL IVPB        1 g 200 mL/hr over 30 Minutes Intravenous Every 24 hours 12/03/20 0158 12/09/20 2159   12/02/20 2200  cefTRIAXone (ROCEPHIN) 1 g in sodium chloride 0.9 % 100 mL IVPB        1 g 200 mL/hr over 30 Minutes Intravenous  Once 12/02/20 2157 12/03/20 0736        Objective: Vitals:   12/04/20 0026 12/04/20 0244 12/04/20 0439 12/04/20 0844  BP: 139/73 (!) 161/68 122/68 130/68  Pulse: (!) 103 (!) 115 (!) 108 (!) 105  Resp: (!) 24 (!) 24 20 18   Temp: 99.7 F (37.6 C) 98.8 F (37.1 C)  98.5 F (36.9 C)  TempSrc: Oral Oral  Oral  SpO2: 97% 97% 100% 100%  Weight:      Height:        Intake/Output Summary (Last 24 hours) at 12/04/2020 1200 Last data filed at 12/04/2020 0920 Gross per 24 hour  Intake 399.72 ml  Output 1 ml  Net 398.72 ml   12/06/2020  12/02/20 2056  Weight: 95.3 kg    Examination:  General exam: Appears calm and comfortable  Respiratory system: Clear to auscultation. Respiratory effort normal. No respiratory distress. No conversational dyspnea.  Cardiovascular system: S1 & S2 heard, tachycardic, regular rhythm. No murmurs. No pedal edema. Gastrointestinal system: Abdomen is moderately distended, soft and nontender. Normal bowel sounds heard. Central nervous system: Alert and oriented x4 today (did not know location or date yesterday). No focal neurological deficits. Speech clear.  Extremities: Symmetric in appearance  Skin: No rashes, lesions or ulcers on exposed skin   Psychiatry: Mood & affect appropriate.   Data Reviewed: I have personally reviewed following labs and imaging studies  CBC: Recent Labs  Lab 12/02/20 2120 12/03/20 0340 12/04/20 0520  WBC 15.5* 15.3* 16.8*  NEUTROABS 13.3* 12.8*  --   HGB 10.6* 10.2* 10.5*  HCT 31.4* 30.5* 31.4*  MCV 90.0 90.2 90.0  PLT 147* 130* 144*   Basic Metabolic Panel: Recent Labs  Lab 12/02/20 2120 12/03/20 0340 12/04/20 0520  NA 136 138 139  K 3.5 3.8 3.4*  CL 103 105 106  CO2 25 26 23   GLUCOSE 153* 153* 79  BUN 34* 29* 24*  CREATININE 2.80* 2.22* 1.62*  CALCIUM 8.9 8.8* 9.2  MG  --  2.1  --    GFR: Estimated Creatinine Clearance: 52.1 mL/min (A) (by C-G formula based on SCr of 1.62 mg/dL (H)).  Liver Function Tests: Recent Labs  Lab 12/02/20 2120 12/03/20 0340  AST 21 18  ALT 33 28  ALKPHOS 25* 25*  BILITOT 1.1 1.3*  PROT 6.2* 5.9*  ALBUMIN 3.8 3.5   Coagulation Profile: Recent Labs  Lab 12/02/20 2120 12/03/20 0340  INR 1.0 1.0    HbA1C: Recent Labs    12/03/20 0350  HGBA1C 11.4*   CBG: Recent Labs  Lab 12/03/20 1252 12/03/20 1613 12/03/20 2054 12/04/20 0752 12/04/20 1147  GLUCAP 76 96 106* 85 87   Sepsis Labs: Recent Labs  Lab 12/02/20 2120 12/03/20 0340  PROCALCITON  --  1.07  LATICACIDVEN 1.8  --     Recent Results (from the past 240 hour(s))  Urine Culture     Status: Abnormal (Preliminary result)   Collection Time: 12/02/20  9:18 PM   Specimen: In/Out Cath Urine  Result Value Ref Range Status   Specimen Description   Final    IN/OUT CATH URINE Performed at [facility], [address], [place], [place]    Special Requests   Final    NONE Performed at [facility], [address] [place], [place]    Culture (A)  Final    >=100,000 COLONIES/mL ESCHERICHIA COLI SUSCEPTIBILITIES TO FOLLOW Performed at [facility], [address], [place], [place]    Report Status  PENDING  Incomplete  Resp Panel by RT-PCR (Flu A&B, Covid) Nasopharyngeal Swab     Status: None   Collection Time: 12/02/20  9:34 PM   Specimen: Nasopharyngeal Swab; Nasopharyngeal(NP) swabs in vial transport medium  Result Value Ref Range Status   SARS Coronavirus 2 by RT PCR NEGATIVE NEGATIVE Final    Comment: (NOTE) SARS-CoV-2 target nucleic acids are NOT DETECTED.  The SARS-CoV-2 RNA is generally detectable in upper respiratory specimens during the acute phase of infection. The lowest concentration of SARS-CoV-2 viral copies this assay can detect is 138 copies/mL. A negative result does not preclude SARS-Cov-2 infection and should not be used as the sole basis  for treatment or other patient management decisions. A negative result may occur with  improper specimen collection/handling, submission of specimen other than nasopharyngeal swab, presence of viral mutation(s) within the areas targeted by this assay, and inadequate number of viral copies(<138 copies/mL). A negative result must be combined with clinical observations, patient history, and epidemiological information. The expected result is Negative.  Fact Sheet for Patients:  BloggerCourse.comhttps://www.fda.gov/media/152166/download  Fact Sheet for Healthcare Providers:  SeriousBroker.ithttps://www.fda.gov/media/152162/download  This test is no t yet approved or cleared by the [place] FDA and  has been authorized for detection and/or diagnosis of SARS-CoV-2 by FDA under an Emergency Use Authorization (EUA). This EUA will remain  in effect (meaning this test can be used) for the duration of the COVID-19 declaration under Section 564(b)(1) of the Act, 21 U.S.C.section 360bbb-3(b)(1), unless the authorization is terminated  or revoked sooner.       Influenza A by PCR NEGATIVE NEGATIVE Final   Influenza B by PCR NEGATIVE NEGATIVE Final    Comment: (NOTE) The Xpert Xpress SARS-CoV-2/FLU/RSV plus assay is intended as an aid in the diagnosis of  influenza from Nasopharyngeal swab specimens and should not be used as a sole basis for treatment. Nasal washings and aspirates are unacceptable for Xpert Xpress SARS-CoV-2/FLU/RSV testing.  Fact Sheet for Patients: BloggerCourse.comhttps://www.fda.gov/media/152166/download  Fact Sheet for Healthcare Providers: SeriousBroker.ithttps://www.fda.gov/media/152162/download  This test is not yet approved or cleared by the [place] FDA and has been authorized for detection and/or diagnosis of SARS-CoV-2 by FDA under an Emergency Use Authorization (EUA). This EUA will remain in effect (meaning this test can be used) for the duration of the COVID-19 declaration under Section 564(b)(1) of the Act, 21 U.S.C. section 360bbb-3(b)(1), unless the authorization is terminated or revoked.  Performed at [facility], [address] [place], [place] [address]       Radiology Studies: CT ABDOMEN PELVIS WO CONTRAST  Result Date: 12/03/2020 CLINICAL DATA:  Pyelonephritis EXAM: CT ABDOMEN AND PELVIS WITHOUT CONTRAST TECHNIQUE: Multidetector CT imaging of the abdomen and pelvis was performed following the standard protocol without IV contrast. COMPARISON:  None. FINDINGS: Lower chest: No acute abnormality. Hepatobiliary: No focal liver abnormality is seen. Status post cholecystectomy. No biliary dilatation. Pancreas: Unremarkable Spleen: Unremarkable Adrenals/Urinary Tract: A 23 mm macroscopic fat containing nodule is seen within the left adrenal gland in keeping with a benign adrenal myelolipoma. The right adrenal gland contains a 20 mm low-attenuation nodule most in keeping with benign adrenal adenoma. The kidneys are normal in size and position. A 2 mm nonobstructing calculus is seen within the upper pole of the right kidney. No ureteral calculi. No hydronephrosis. No perinephric fluid collections are identified. The bladder is unremarkable. Stomach/Bowel: Mild sigmoid diverticulosis. The stomach, small bowel, and  large bowel are otherwise unremarkable. Appendix absent. No free intraperitoneal gas or fluid. Vascular/Lymphatic: Moderate atherosclerotic calcification is seen within the aortoiliac vasculature. No aortic aneurysm identified. No pathologic adenopathy within the abdomen and pelvis. Reproductive: Uterus and bilateral adnexa are unremarkable. Other: No abdominal wall hernia.  The rectum is unremarkable. Musculoskeletal: No acute bone abnormality. No lytic or blastic bone lesion identified. IMPRESSION: No acute intra-abdominal pathology identified. No definite radiographic explanation for the patient's reported symptoms. Minimal nonobstructing right nephrolithiasis. No urolithiasis. No hydronephrosis. 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 Mild sigmoid diverticulosis. Aortic Atherosclerosis (ICD10-I70.0). Electronically Signed   By: Helyn NumbersAshesh  Parikh MD   On: 12/03/2020 03:52   DG  Chest Port 1 View  Result Date: 12/02/2020 CLINICAL DATA:  Weakness altered mental status EXAM: PORTABLE CHEST 1 VIEW COMPARISON:  CT 10/03/2020 FINDINGS: Low lung volumes. Post sternotomy changes. Subsegmental atelectasis left base. Exaggerated cardiomediastinal silhouette by low lung volume. Aortic atherosclerosis. No pneumothorax IMPRESSION: No active disease.  Low lung volumes Electronically Signed   By: Jasmine Pang M.D.   On: 12/02/2020 21:36     Scheduled Meds:  enoxaparin (LOVENOX) injection  30 mg Subcutaneous Q24H   insulin aspart  0-15 Units Subcutaneous TID AC & HS   insulin aspart protamine- aspart  20 Units Subcutaneous BID WC   midodrine  5 mg Oral BID   pantoprazole  40 mg Oral Daily   potassium chloride  30 mEq Oral BID   predniSONE  10 mg Oral Daily   rosuvastatin  40 mg Oral Daily   Continuous Infusions:  sodium chloride 125 mL/hr at 12/04/20 0752   cefTRIAXone (ROCEPHIN)  IV 1 g (12/03/20 2310)     LOS: 1 day    Time spent:  30 minutes   Sharlene Dory, DO [facility] 12/04/2020, 12:00 PM   Available via Epic secure chat 7am-7pm After these hours, please refer to coverage provider listed on [email or web address]

## 2020-12-05 ENCOUNTER — Inpatient Hospital Stay (HOSPITAL_COMMUNITY): Payer: Medicare Other

## 2020-12-05 DIAGNOSIS — A419 Sepsis, unspecified organism: Secondary | ICD-10-CM | POA: Diagnosis not present

## 2020-12-05 DIAGNOSIS — N390 Urinary tract infection, site not specified: Secondary | ICD-10-CM | POA: Diagnosis not present

## 2020-12-05 LAB — GLUCOSE, CAPILLARY
Glucose-Capillary: 135 mg/dL — ABNORMAL HIGH (ref 70–99)
Glucose-Capillary: 86 mg/dL (ref 70–99)
Glucose-Capillary: 169 mg/dL — ABNORMAL HIGH (ref 70–99)
Glucose-Capillary: 70 mg/dL (ref 70–99)

## 2020-12-05 LAB — BASIC METABOLIC PANEL
Anion gap: 10 (ref 5–15)
BUN: 19 mg/dL (ref 8–23)
CO2: 21 mmol/L — ABNORMAL LOW (ref 22–32)
Calcium: 9.1 mg/dL (ref 8.9–10.3)
Chloride: 109 mmol/L (ref 98–111)
Creatinine, Ser: 1.59 mg/dL — ABNORMAL HIGH (ref 0.61–1.24)
GFR, Estimated: 47 mL/min — ABNORMAL LOW (ref >60)
Glucose, Bld: 86 mg/dL (ref 70–99)
Potassium: 3.9 mmol/L (ref 3.5–5.1)
Sodium: 140 mmol/L (ref 135–145)

## 2020-12-05 LAB — URINE CULTURE: Culture: >=100000 — AB

## 2020-12-05 LAB — CBC
HCT: 31.8 % — ABNORMAL LOW (ref 39.0–52.0)
Hemoglobin: 10.5 g/dL — ABNORMAL LOW (ref 13.0–17.0)
MCH: 30.3 pg (ref 26.0–34.0)
MCHC: 33 g/dL (ref 30.0–36.0)
MCV: 91.6 fL (ref 80.0–100.0)
Platelets: 151 10*3/uL (ref 150–400)
RBC: 3.47 MIL/uL — ABNORMAL LOW (ref 4.22–5.81)
RDW: 15.3 % (ref 11.5–15.5)
WBC: 11.7 10*3/uL — ABNORMAL HIGH (ref 4.0–10.5)
nRBC: 0 % (ref 0.0–0.2)

## 2020-12-05 MED ORDER — HALOPERIDOL 2 MG PO TABS
2.0000 mg | ORAL_TABLET | Freq: Every day | ORAL | Status: DC
  Administered 2020-12-05 – 2020-12-06 (×2): 2 mg via ORAL
  Filled 2020-12-05 (×2): qty 1

## 2020-12-05 MED ORDER — CEPHALEXIN 500 MG PO CAPS
500.0000 mg | ORAL_CAPSULE | Freq: Three times a day (TID) | ORAL | Status: DC
  Administered 2020-12-06 – 2020-12-07 (×5): 500 mg via ORAL
  Filled 2020-12-05 (×5): qty 1

## 2020-12-05 MED ORDER — MELATONIN 3 MG PO TABS
3.0000 mg | ORAL_TABLET | Freq: Every day | ORAL | Status: DC
  Administered 2020-12-05 – 2020-12-06 (×2): 3 mg via ORAL
  Filled 2020-12-05 (×2): qty 1

## 2020-12-05 MED ORDER — CEPHALEXIN 500 MG PO CAPS: 500.0000 mg | ORAL_CAPSULE | Freq: Three times a day (TID) | ORAL | 0 refills | Status: AC

## 2020-12-05 MED ORDER — SODIUM CHLORIDE 0.9 % IV SOLN
1.0000 g | Freq: Once | INTRAVENOUS | Status: AC
  Administered 2020-12-05: 1 g via INTRAVENOUS
  Filled 2020-12-05: qty 10

## 2020-12-05 MED ORDER — ENOXAPARIN SODIUM 40 MG/0.4ML IJ SOSY
40.0000 mg | PREFILLED_SYRINGE | INTRAMUSCULAR | Status: DC
  Administered 2020-12-06 – 2020-12-07 (×2): 40 mg via SUBCUTANEOUS
  Filled 2020-12-05 (×2): qty 0.4

## 2020-12-05 NOTE — Discharge Summary (Signed)
Physician Discharge Summary  Anthony Morse ZOX:[id number] DOB: Oct 22, 1950 DOA: 12/02/2020  PCP: Annita Brod, MD  Admit date: 12/02/2020 Discharge date: 12/05/2020  Admitted From: Independent living place Disposition: Independent living facility  Recommendations for Outpatient Follow-up:  Follow up with PCP in 1-2 weeks Please obtain BMP/CBC in one week   Home Health: Not applicable Equipment/Devices: Not applicable  Discharge Condition: Stable CODE STATUS: Full code Diet recommendation: Low-carb diet  Discharge summary: 70 year old gentleman has a history of type 2 diabetes on insulin, coronary artery disease, CKD stage IIIb, GERD and obstructive sleep apnea who is recently being worked up for Lewy body dementia was brought to the emergency room with weakness and lethargy.  On presentation he was found with leukocytosis, fever, tachycardia and tachypnea.  UA was suggestive of infection.  Treated with fluid resuscitation and started on Rocephin.  Temperature was 101.9.  CT scan of the abdomen pelvis with no evidence of urinary obstruction.  Sepsis present on admission secondary to E. coli UTI: Blood cultures negative.  Urine culture with E. coli sensitive to cephalosporins.  Treated with Rocephin, day 4 today.  CT scan with no evidence of urological complications.  No urinary retention noted.  Will treat as complicated UTI with 10 days of antibiotics, Keflex 500 mg 3 times a day for 6 more days.  Acute on chronic kidney disease stage IIIb: He does have some baseline chronic kidney disease.  Resuscitated with fluid and stabilized.  Creatinine is 1.59 today. Patient was recently diagnosed with minimal-change disease and currently remains on tapering dose of steroids.  He is currently on prednisone 10 mg and followed by nephrologist.  Plan is to cut down his steroids to 5 mg and start on tacrolimus.  Outpatient follow-up.  Essential hypertension: Blood pressures are stable.  Patient is  frequently getting acute renal failure.  We will hold off on his Lasix and losartan until follow-up visits with nephrology.  Acute metabolic encephalopathy with history of possible Lewy body dementia: Needs fall precautions and delirium precautions.  Had used occasional doses of Haldol in the hospital.  Currently adequately stabilized. Patient lives at independent living facility and has adequate support system present. Follows up with neurology.  They are in the process of starting medication management for him.  Type 2 diabetes: Fairly controlled.  On 70/30 insulin usually takes 20 units twice a day at home.  Patient is medically stabilized.  Will receive fourth dose of IV Rocephin before discharge today.  Called and discussed with patient's daughter who will be looking after his care.  To avoid further delirium, he may benefit going back to a familiar environment.   Discharge Diagnoses:  Principal Problem:   Sepsis secondary to UTI [facility]) Active Problems:   Coronary artery disease involving native heart without angina pectoris   Dementia (HCC)   Acute metabolic encephalopathy   Chronic kidney disease, stage 3b (HCC)   Type 2 diabetes mellitus with stage 3b chronic kidney disease, with long-term current use of insulin (HCC)   Mixed diabetic hyperlipidemia associated with type 2 diabetes mellitus (HCC)   Diabetic polyneuropathy associated with type 2 diabetes mellitus (HCC)   Gastroesophageal reflux disease without esophagitis    Discharge Instructions  Discharge Instructions     Diet - low sodium heart healthy   Complete by: As directed    Discharge instructions   Complete by: As directed    Hold off taking Losartan and Lasix until seen in follow up in office .  Take steroids  and other medicine as prescribed by the Kidney doctor without any changes   Increase activity slowly   Complete by: As directed       Allergies as of 12/05/2020       Reactions   Tramadol     Delusions (intolerance), Hallucinations,        Medication List     STOP taking these medications    amoxicillin-clavulanate 875-125 MG tablet Commonly known as: AUGMENTIN   furosemide 40 MG tablet Commonly known as: LASIX   losartan 25 MG tablet Commonly known as: COZAAR   omeprazole 20 MG capsule Commonly known as: PRILOSEC       TAKE these medications    aspirin EC 81 MG tablet Take 81 mg by mouth daily. Swallow whole.   calcium carbonate 500 MG chewable tablet Commonly known as: TUMS - dosed in mg elemental calcium Chew 500 mg by mouth daily as needed for indigestion or heartburn.   cephALEXin 500 MG capsule Commonly known as: KEFLEX Take 1 capsule (500 mg total) by mouth 3 (three) times daily for 6 days.   fenofibrate micronized 130 MG capsule Commonly known as: ANTARA Take 130 mg by mouth daily before breakfast.   FreeStyle Libre 14 Day Sensor Misc Apply topically as directed.   gabapentin 100 MG capsule Commonly known as: NEURONTIN Take 100 mg by mouth 2 (two) times daily.   insulin aspart protamine- aspart (70-30) 100 UNIT/ML injection Commonly known as: NOVOLOG MIX 70/30 Inject 25-50 Units into the skin 2 (two) times daily with a meal.   midodrine 5 MG tablet Commonly known as: PROAMATINE Take 5 mg by mouth 2 (two) times daily.   niacin 500 MG tablet Take 500 mg by mouth at bedtime.   Praluent 75 MG/ML Soaj Generic drug: Alirocumab Inject 1 pen into the skin every 14 (fourteen) days.   predniSONE 10 MG tablet Commonly known as: DELTASONE Take 10 mg by mouth daily with breakfast.   rosuvastatin 40 MG tablet Commonly known as: CRESTOR Take 1 tablet (40 mg total) by mouth daily.   tacrolimus 0.5 MG capsule Commonly known as: PROGRAF Take 0.5 mg by mouth 2 (two) times daily.        Follow-up Information     Annita Brod, MD Follow up in 2 week(s).   Specialty: Internal Medicine Contact information: [address] [place] [address] [phone]                Allergies  Allergen Reactions   Tramadol     Delusions (intolerance), Hallucinations,    Consultations: None.   Procedures/Studies: CT ABDOMEN PELVIS WO CONTRAST  Result Date: 12/03/2020 CLINICAL DATA:  Pyelonephritis EXAM: CT ABDOMEN AND PELVIS WITHOUT CONTRAST TECHNIQUE: Multidetector CT imaging of the abdomen and pelvis was performed following the standard protocol without IV contrast. COMPARISON:  None. FINDINGS: Lower chest: No acute abnormality. Hepatobiliary: No focal liver abnormality is seen. Status post cholecystectomy. No biliary dilatation. Pancreas: Unremarkable Spleen: Unremarkable Adrenals/Urinary Tract: A 23 mm macroscopic fat containing nodule is seen within the left adrenal gland in keeping with a benign adrenal myelolipoma. The right adrenal gland contains a 20 mm low-attenuation nodule most in keeping with benign adrenal adenoma. The kidneys are normal in size and position. A 2 mm nonobstructing calculus is seen within the upper pole of the right kidney. No ureteral calculi. No hydronephrosis. No perinephric fluid collections are identified. The bladder is unremarkable. Stomach/Bowel: Mild sigmoid diverticulosis. The stomach, small bowel, and large  bowel are otherwise unremarkable. Appendix absent. No free intraperitoneal gas or fluid. Vascular/Lymphatic: Moderate atherosclerotic calcification is seen within the aortoiliac vasculature. No aortic aneurysm identified. No pathologic adenopathy within the abdomen and pelvis. Reproductive: Uterus and bilateral adnexa are unremarkable. Other: No abdominal wall hernia.  The rectum is unremarkable. Musculoskeletal: No acute bone abnormality. No lytic or blastic bone lesion identified. IMPRESSION: No acute intra-abdominal pathology identified. No definite radiographic explanation for the patient's reported symptoms. Minimal nonobstructing right nephrolithiasis. No urolithiasis. No  hydronephrosis. 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 Mild sigmoid diverticulosis. Aortic Atherosclerosis (ICD10-I70.0). Electronically Signed   By: Helyn NumbersAshesh  Parikh MD   On: 12/03/2020 03:52   DG Chest Port 1 View  Result Date: 12/02/2020 CLINICAL DATA:  Weakness altered mental status EXAM: PORTABLE CHEST 1 VIEW COMPARISON:  CT 10/03/2020 FINDINGS: Low lung volumes. Post sternotomy changes. Subsegmental atelectasis left base. Exaggerated cardiomediastinal silhouette by low lung volume. Aortic atherosclerosis. No pneumothorax IMPRESSION: No active disease.  Low lung volumes Electronically Signed   By: Jasmine PangKim  Fujinaga M.D.   On: 12/02/2020 21:36   (Echo, Carotid, EGD, Colonoscopy, ERCP)    Subjective: Patient seen and examined.  He was walking in the hallway with nurse technician and denies any complaints to me.  He tells me that he feels better and would like to go home.  He does he lives in an independent living facility, he tells me that there is a large group of people who look after each other. Called and discussed with patient's daughter for discharge planning.   Discharge Exam: Vitals:   12/04/20 1950 12/05/20 0411  BP: 118/77 124/78  Pulse: 97 95  Resp: 20 20  Temp: 98.3 F (36.8 C) 98.4 F (36.9 C)  SpO2: 100% 100%   Vitals:   12/04/20 0844 12/04/20 1252 12/04/20 1950 12/05/20 0411  BP: 130/68 (!) 144/86 118/77 124/78  Pulse: (!) 105 (!) 105 97 95  Resp: 18 16 20 20   Temp: 98.5 F (36.9 C) 99 F (37.2 C) 98.3 F (36.8 C) 98.4 F (36.9 C)  TempSrc: Oral Oral Oral   SpO2: 100% 98% 100% 100%  Weight:      Height:        General: Pt is alert, awake, not in acute distress Patient is alert oriented x2.  Pleasant.  Conversant. Cardiovascular: RRR, S1/S2 +, no rubs, no gallops Respiratory: CTA bilaterally, no wheezing, no rhonchi Abdominal: Soft, NT, ND, bowel sounds + Extremities: no edema, no  cyanosis    The results of significant diagnostics from this hospitalization (including imaging, microbiology, ancillary and laboratory) are listed below for reference.     Microbiology: Recent Results (from the past 240 hour(s))  Blood culture (routine single)     Status: None (Preliminary result)   Collection Time: 12/02/20  9:15 PM   Specimen: BLOOD  Result Value Ref Range Status   Specimen Description   Final    BLOOD RIGHT ANTECUBITAL Performed at [facility], [address] [place], [place] [address]    Special Requests   Final    BOTTLES DRAWN AEROBIC AND ANAEROBIC Blood Culture results may not be optimal due to an excessive volume of blood received in culture bottles Performed at [facility], [address] [place], [place] [address]    Culture   Final    NO GROWTH 2 DAYS Performed at [facility], [address] [place],  [place] [address]    Report Status PENDING  Incomplete  Urine Culture     Status: Abnormal   Collection Time: 12/02/20  9:18 PM   Specimen: In/Out Cath Urine  Result Value Ref Range Status   Specimen Description   Final    IN/OUT CATH URINE Performed at [facility], [address] [place], [place] [address]    Special Requests   Final    NONE Performed at [facility], [address] [place], [place] [address]    Culture >=100,000 COLONIES/mL ESCHERICHIA COLI (A)  Final   Report Status 12/05/2020 FINAL  Final   Organism ID, Bacteria ESCHERICHIA COLI (A)  Final      Susceptibility   Escherichia coli - MIC*    AMPICILLIN >=32 RESISTANT Resistant     CEFAZOLIN 16 SENSITIVE Sensitive     CEFEPIME <=0.12 SENSITIVE Sensitive     CEFTRIAXONE <=0.25 SENSITIVE Sensitive     CIPROFLOXACIN <=0.25 SENSITIVE Sensitive     GENTAMICIN <=1 SENSITIVE Sensitive     IMIPENEM <=0.25 SENSITIVE Sensitive     NITROFURANTOIN 32 SENSITIVE Sensitive     TRIMETH/SULFA >=320  RESISTANT Resistant     AMPICILLIN/SULBACTAM >=32 RESISTANT Resistant     PIP/TAZO 8 SENSITIVE Sensitive     * >=100,000 COLONIES/mL ESCHERICHIA COLI  Resp Panel by RT-PCR (Flu A&B, Covid) Nasopharyngeal Swab     Status: None   Collection Time: 12/02/20  9:34 PM   Specimen: Nasopharyngeal Swab; Nasopharyngeal(NP) swabs in vial transport medium  Result Value Ref Range Status   SARS Coronavirus 2 by RT PCR NEGATIVE NEGATIVE Final    Comment: (NOTE) SARS-CoV-2 target nucleic acids are NOT DETECTED.  The SARS-CoV-2 RNA is generally detectable in upper respiratory specimens during the acute phase of infection. The lowest concentration of SARS-CoV-2 viral copies this assay can detect is 138 copies/mL. A negative result does not preclude SARS-Cov-2 infection and should not be used as the sole basis for treatment or other patient management decisions. A negative result may occur with  improper specimen collection/handling, submission of specimen other than nasopharyngeal swab, presence of viral mutation(s) within the areas targeted by this assay, and inadequate number of viral copies(<138 copies/mL). A negative result must be combined with clinical observations, patient history, and epidemiological information. The expected result is Negative.  Fact Sheet for Patients:  BloggerCourse.com  Fact Sheet for Healthcare Providers:  SeriousBroker.it  This test is no t yet approved or cleared by the [place] FDA and  has been authorized for detection and/or diagnosis of SARS-CoV-2 by FDA under an Emergency Use Authorization (EUA). This EUA will remain  in effect (meaning this test can be used) for the duration of the COVID-19 declaration under Section 564(b)(1) of the Act, 21 U.S.C.section 360bbb-3(b)(1), unless the authorization is terminated  or revoked sooner.       Influenza A by PCR NEGATIVE NEGATIVE Final   Influenza B by PCR  NEGATIVE NEGATIVE Final    Comment: (NOTE) The Xpert Xpress SARS-CoV-2/FLU/RSV plus assay is intended as an aid in the diagnosis of influenza from Nasopharyngeal swab specimens and should not be used as a sole basis for treatment. Nasal washings and aspirates are unacceptable for Xpert Xpress SARS-CoV-2/FLU/RSV testing.  Fact Sheet for Patients: BloggerCourse.com  Fact Sheet for Healthcare Providers: SeriousBroker.it  This test is not yet approved or cleared by the [place] FDA and has been authorized for detection and/or diagnosis of SARS-CoV-2 by FDA under an Emergency  Use Authorization (EUA). This EUA will remain in effect (meaning this test can be used) for the duration of the COVID-19 declaration under Section 564(b)(1) of the Act, 21 U.S.C. section 360bbb-3(b)(1), unless the authorization is terminated or revoked.  Performed at [facility], [address], [place], [place] [address]      Labs: BNP (last 3 results) No results for input(s): BNP in the last 8760 hours. Basic Metabolic Panel: Recent Labs  Lab 12/02/20 2120 12/03/20 0340 12/04/20 0520 12/05/20 0423  NA 136 138 139 140  K 3.5 3.8 3.4* 3.9  CL 103 105 106 109  CO2 25 26 23  21*  GLUCOSE 153* 153* 79 86  BUN 34* 29* 24* 19  CREATININE 2.80* 2.22* 1.62* 1.59*  CALCIUM 8.9 8.8* 9.2 9.1  MG  --  2.1  --   --    Liver Function Tests: Recent Labs  Lab 12/02/20 2120 12/03/20 0340  AST 21 18  ALT 33 28  ALKPHOS 25* 25*  BILITOT 1.1 1.3*  PROT 6.2* 5.9*  ALBUMIN 3.8 3.5   No results for input(s): LIPASE, AMYLASE in the last 168 hours. No results for input(s): AMMONIA in the last 168 hours. CBC: Recent Labs  Lab 12/02/20 2120 12/03/20 0340 12/04/20 0520 12/05/20 0423  WBC 15.5* 15.3* 16.8* 11.7*  NEUTROABS 13.3* 12.8*  --   --   HGB 10.6* 10.2* 10.5* 10.5*  HCT 31.4* 30.5* 31.4* 31.8*  MCV 90.0 90.2 90.0 91.6  PLT  147* 130* 144* 151   Cardiac Enzymes: No results for input(s): CKTOTAL, CKMB, CKMBINDEX, TROPONINI in the last 168 hours. BNP: Invalid input(s): POCBNP CBG: Recent Labs  Lab 12/04/20 1147 12/04/20 1555 12/04/20 1952 12/05/20 0736 12/05/20 1206  GLUCAP 87 77 106* 135* 86   D-Dimer No results for input(s): DDIMER in the last 72 hours. Hgb A1c Recent Labs    12/03/20 0350  HGBA1C 11.4*   Lipid Profile No results for input(s): CHOL, HDL, LDLCALC, TRIG, CHOLHDL, LDLDIRECT in the last 72 hours. Thyroid function studies No results for input(s): TSH, T4TOTAL, T3FREE, THYROIDAB in the last 72 hours.  Invalid input(s): FREET3 Anemia work up No results for input(s): VITAMINB12, FOLATE, FERRITIN, TIBC, IRON, RETICCTPCT in the last 72 hours. Urinalysis    Component Value Date/Time   COLORURINE YELLOW 12/02/2020 2118   APPEARANCEUR CLOUDY (A) 12/02/2020 2118   APPEARANCEUR Clear 06/14/2020 1126   LABSPEC 1.019 12/02/2020 2118   PHURINE 5.0 12/02/2020 2118   GLUCOSEU 50 (A) 12/02/2020 2118   HGBUR LARGE (A) 12/02/2020 2118   BILIRUBINUR NEGATIVE 12/02/2020 2118   BILIRUBINUR Negative 06/14/2020 1126   KETONESUR 5 (A) 12/02/2020 2118   PROTEINUR 100 (A) 12/02/2020 2118   NITRITE NEGATIVE 12/02/2020 2118   LEUKOCYTESUR LARGE (A) 12/02/2020 2118   Sepsis Labs Invalid input(s): PROCALCITONIN,  WBC,  LACTICIDVEN Microbiology Recent Results (from the past 240 hour(s))  Blood culture (routine single)     Status: None (Preliminary result)   Collection Time: 12/02/20  9:15 PM   Specimen: BLOOD  Result Value Ref Range Status   Specimen Description   Final    BLOOD RIGHT ANTECUBITAL Performed at [facility], [address] [place], [place]    Special Requests   Final    BOTTLES DRAWN AEROBIC AND ANAEROBIC Blood Culture results may not be optimal due to an excessive volume of blood received in culture bottles Performed at [facility], [address] [place],  [place] [address]    Culture   Final    NO GROWTH 2 DAYS Performed at [facility], [address] [place], [place] [address]    Report Status PENDING  Incomplete  Urine Culture     Status: Abnormal   Collection Time: 12/02/20  9:18 PM   Specimen: In/Out Cath Urine  Result Value Ref Range Status   Specimen Description   Final    IN/OUT CATH URINE Performed at [facility], [address], [place], [place] [address]    Special Requests   Final    NONE Performed at [facility], [address] [place], [place] [address]    Culture >=100,000 COLONIES/mL ESCHERICHIA COLI (A)  Final   Report Status 12/05/2020 FINAL  Final   Organism ID, Bacteria ESCHERICHIA COLI (A)  Final      Susceptibility   Escherichia coli - MIC*    AMPICILLIN >=32 RESISTANT Resistant     CEFAZOLIN 16 SENSITIVE Sensitive     CEFEPIME <=0.12 SENSITIVE Sensitive     CEFTRIAXONE <=0.25 SENSITIVE Sensitive     CIPROFLOXACIN <=0.25 SENSITIVE Sensitive     GENTAMICIN <=1 SENSITIVE Sensitive     IMIPENEM <=0.25 SENSITIVE Sensitive     NITROFURANTOIN 32 SENSITIVE Sensitive     TRIMETH/SULFA >=320 RESISTANT Resistant     AMPICILLIN/SULBACTAM >=32 RESISTANT Resistant     PIP/TAZO 8 SENSITIVE Sensitive     * >=100,000 COLONIES/mL ESCHERICHIA COLI  Resp Panel by RT-PCR (Flu A&B, Covid) Nasopharyngeal Swab     Status: None   Collection Time: 12/02/20  9:34 PM   Specimen: Nasopharyngeal Swab; Nasopharyngeal(NP) swabs in vial transport medium  Result Value Ref Range Status   SARS Coronavirus 2 by RT PCR NEGATIVE NEGATIVE Final    Comment: (NOTE) SARS-CoV-2 target nucleic acids are NOT DETECTED.  The SARS-CoV-2 RNA is generally detectable in upper respiratory specimens during the acute phase of infection. The lowest concentration of SARS-CoV-2 viral copies this assay can detect is 138 copies/mL. A negative result does not preclude  SARS-Cov-2 infection and should not be used as the sole basis for treatment or other patient management decisions. A negative result may occur with  improper specimen collection/handling, submission of specimen other than nasopharyngeal swab, presence of viral mutation(s) within the areas targeted by this assay, and inadequate number of viral copies(<138 copies/mL). A negative result must be combined with clinical observations, patient history, and epidemiological information. The expected result is Negative.  Fact Sheet for Patients:  BloggerCourse.com  Fact Sheet for Healthcare Providers:  SeriousBroker.it  This test is no t yet approved or cleared by the [place] FDA and  has been authorized for detection and/or diagnosis of SARS-CoV-2 by FDA under an Emergency Use Authorization (EUA). This EUA will remain  in effect (meaning this test can be used) for the duration of the COVID-19 declaration under Section 564(b)(1) of the Act, 21 U.S.C.section 360bbb-3(b)(1), unless the authorization is terminated  or revoked sooner.       Influenza A by PCR NEGATIVE NEGATIVE Final   Influenza B by PCR NEGATIVE NEGATIVE Final    Comment: (NOTE) The Xpert Xpress SARS-CoV-2/FLU/RSV plus assay is intended as an aid in the diagnosis of influenza from Nasopharyngeal swab specimens and should not be used as a sole basis for treatment. Nasal washings and aspirates are unacceptable for Xpert Xpress SARS-CoV-2/FLU/RSV testing.  Fact Sheet for Patients: BloggerCourse.com  Fact Sheet for Healthcare Providers: SeriousBroker.it  This test  is not yet approved or cleared by the [place] and has been authorized for detection and/or diagnosis of SARS-CoV-2 by FDA under an Emergency Use Authorization (EUA). This EUA will remain in effect (meaning this test can be used) for the duration of  the COVID-19 declaration under Section 564(b)(1) of the Act, 21 U.S.C. section 360bbb-3(b)(1), unless the authorization is terminated or revoked.  Performed at [facility], [address], [place], [place] [address]      Time coordinating discharge:  35 minutes  SIGNED:   Dorcas Carrow, MD  [facility] 12/05/2020, 1:23 PM

## 2020-12-05 NOTE — Progress Notes (Signed)
Nutrition Brief Note  Patient identified on the Malnutrition Screening Tool (MST) Report (score of 2.0)  Wt Readings from Last 15 Encounters:  12/02/20 95.3 kg  10/30/20 95.3 kg  10/23/20 94.8 kg  10/20/20 94.8 kg  08/23/20 104.3 kg  06/14/20 98.2 kg    Body mass index is 29.3 kg/m. Patient meets criteria for overweight status based on current BMI. Skin WDL.   Current diet order is Heart Healthy/Carb Modified and he is eating 75-100% at meals. Labs and medications reviewed.   He was admitted with working dx of sepsis 2/2 UTI. He is from [facility] independent living.   Discharge order entered this AM; no discharge summary yet. Plan for return to [facility] independent living.  No nutrition interventions warranted at this time. If nutrition issues arise, please consult RD.       Trenton Gammon, MS, RD, LDN, CNSC Inpatient Clinical Dietitian RD pager # available in [facility]  After hours/weekend pager # available in [facility]

## 2020-12-05 NOTE — [place] FL2 (Signed)
[place] MEDICAID FL2 LEVEL OF CARE SCREENING TOOL     IDENTIFICATION  Patient Name: Anthony Morse Birthdate: 11/29/1950 Sex: male Admission Date (Current Location): 12/02/2020  [facility] and [place] Number:  Producer, television/film/video and Address:  [facility],  [address] [place], [place] [address]      Provider Number: [id number]  Attending Physician Name and Address:  Dorcas Carrow, MD  Relative Name and Phone Number:  Ihor Dow Daughter   [phone]    Current Level of Care: Hospital Recommended Level of Care: Assisted Living Facility Prior Approval Number:    Date Approved/Denied:   PASRR Number:    Discharge Plan: Domiciliary (Rest home) [facility])    Current Diagnoses: Patient Active Problem List   Diagnosis Date Noted   Sepsis secondary to UTI (HCC) 12/03/2020   Acute metabolic encephalopathy 12/03/2020   Chronic kidney disease, stage 3b (HCC) 12/03/2020   Type 2 diabetes mellitus with stage 3b chronic kidney disease, with long-term current use of insulin (HCC) 12/03/2020   Mixed diabetic hyperlipidemia associated with type 2 diabetes mellitus (HCC) 12/03/2020   Diabetic polyneuropathy associated with type 2 diabetes mellitus (HCC) 12/03/2020   Gastroesophageal reflux disease without esophagitis 12/03/2020   Dementia (HCC) 10/25/2020   Coronary artery disease involving native heart without angina pectoris 10/22/2017    Orientation RESPIRATION BLADDER Height & Weight     Self, Place  Normal Continent Weight: 210 lb 1.6 oz (95.3 kg) Height:  5\' 11"  (180.3 cm)  BEHAVIORAL SYMPTOMS/MOOD NEUROLOGICAL BOWEL NUTRITION STATUS      Continent Diet (Regular)  AMBULATORY STATUS COMMUNICATION OF NEEDS Skin   Supervision Verbally Normal                       Personal Care Assistance Level of Assistance  Bathing, Feeding, Dressing Bathing Assistance: Limited assistance Feeding assistance: Independent Dressing Assistance: Limited  assistance     Functional Limitations Info  Sight, Speech, Hearing Sight Info: Adequate Hearing Info: Adequate Speech Info: Adequate    SPECIAL CARE FACTORS FREQUENCY                       Contractures Contractures Info: Not present    Additional Factors Info  Code Status, Allergies, Insulin Sliding Scale Code Status Info: Full Code Allergies Info: Tramadol   Insulin Sliding Scale Info: insulin aspart (novoLOG) injection 0-15 Units 3x a day with meals       Current Medications (12/05/2020):  This is the current hospital active medication list Current Facility-Administered Medications  Medication Dose Route Frequency Provider Last Rate Last Admin   0.9 %  sodium chloride infusion   Intravenous Continuous Shalhoub, 12/07/2020, MD 125 mL/hr at 12/05/20 0259 New Bag at 12/05/20 0259   acetaminophen (TYLENOL) tablet 650 mg  650 mg Oral Q6H PRN 12/07/20, MD   650 mg at 12/04/20 2253   Or   acetaminophen (TYLENOL) suppository 650 mg  650 mg Rectal Q6H PRN 2254, MD       [START ON 12/06/2020] enoxaparin (LOVENOX) injection 40 mg  40 mg Subcutaneous Q24H 12/08/2020 M, RPH       haloperidol lactate (HALDOL) injection 2.5 mg  2.5 mg Intramuscular Q6H PRN M, MD   2.5 mg at 12/05/20 0420   insulin aspart (novoLOG) injection 0-15 Units  0-15 Units Subcutaneous TID AC & HS Shalhoub, 07-20-1984, MD   2 Units at 12/05/20  0755   insulin aspart protamine- aspart (NOVOLOG MIX 70/30) injection 20 Units  20 Units Subcutaneous BID WC Shalhoub, Deno Lunger, MD   20 Units at 12/05/20 0755   midodrine (PROAMATINE) tablet 5 mg  5 mg Oral BID Marinda Elk, MD   5 mg at 12/05/20 0803   ondansetron (ZOFRAN) tablet 4 mg  4 mg Oral Q6H PRN Shalhoub, Deno Lunger, MD       Or   ondansetron [facility]) injection 4 mg  4 mg Intravenous Q6H PRN Shalhoub, Deno Lunger, MD       pantoprazole (PROTONIX) EC tablet 40 mg  40 mg Oral Daily Shalhoub, Deno Lunger, MD   40 mg at 12/05/20  0803   polyethylene glycol (MIRALAX / GLYCOLAX) packet 17 g  17 g Oral Daily PRN Shalhoub, Deno Lunger, MD       predniSONE (DELTASONE) tablet 10 mg  10 mg Oral Daily Shalhoub, Deno Lunger, MD   10 mg at 12/05/20 0803   rosuvastatin (CRESTOR) tablet 40 mg  40 mg Oral Daily Shalhoub, Deno Lunger, MD   40 mg at 12/05/20 0803     Discharge Medications: STOP taking these medications     amoxicillin-clavulanate 875-125 MG tablet Commonly known as: AUGMENTIN    furosemide 40 MG tablet Commonly known as: LASIX    losartan 25 MG tablet Commonly known as: COZAAR    omeprazole 20 MG capsule Commonly known as: PRILOSEC           TAKE these medications     aspirin EC 81 MG tablet Take 81 mg by mouth daily. Swallow whole.    calcium carbonate 500 MG chewable tablet Commonly known as: TUMS - dosed in mg elemental calcium Chew 500 mg by mouth daily as needed for indigestion or heartburn.    cephALEXin 500 MG capsule Commonly known as: KEFLEX Take 1 capsule (500 mg total) by mouth 3 (three) times daily for 6 days.    fenofibrate micronized 130 MG capsule Commonly known as: ANTARA Take 130 mg by mouth daily before breakfast.    FreeStyle Libre 14 Day Sensor Misc Apply topically as directed.    gabapentin 100 MG capsule Commonly known as: NEURONTIN Take 100 mg by mouth 2 (two) times daily.    insulin aspart protamine- aspart (70-30) 100 UNIT/ML injection Commonly known as: NOVOLOG MIX 70/30 Inject 25-50 Units into the skin 2 (two) times daily with a meal.    midodrine 5 MG tablet Commonly known as: PROAMATINE Take 5 mg by mouth 2 (two) times daily.    niacin 500 MG tablet Take 500 mg by mouth at bedtime.    Praluent 75 MG/ML Soaj Generic drug: Alirocumab Inject 1 pen into the skin every 14 (fourteen) days.    predniSONE 10 MG tablet Commonly known as: DELTASONE Take 10 mg by mouth daily with breakfast.    rosuvastatin 40 MG tablet Commonly known as: CRESTOR Take 1 tablet  (40 mg total) by mouth daily.    tacrolimus 0.5 MG capsule Commonly known as: PROGRAF Take 0.5 mg by mouth 2 (two) times daily.    Relevant Imaging Results:  Relevant Lab Results:   Additional Information SSN [id number]  [name], LCSW

## 2020-12-05 NOTE — TOC Initial Note (Addendum)
Transition of Care [facility]) - Initial/Assessment Note    Patient Details  Name: Anthony Morse MRN: [id number] Date of Birth: 1950-08-24  [facility]) CM/SW Contact:    [name], LCSW Phone Number: 12/05/2020, 4:17 PM  Clinical Narrative:                  Patient is from [facility].  He is alert and oriented x3 today, he was alert and oriented x1 yesterday.  CSW spoke to [facility] AL[facility] they will have a bed available for him on Wednesday.  Patient currently in Independent Living, and can not afford to pay for extra in home care and it is not safe for him to go to return to ILF alone.  Patient's daughter stated she is unable to take care of patient and he does not have 24 hour supervision available.    Per [place], [phone] at ALF they have spoken to patient's family and can accept him on Wednesday, he will need a chest x-ray to rule out TB, or have QuantiFERON-TB blood test.  Per attending physician he will order chest x-ray.  CSW faxed FL2 and DC summary to [phone] per  [facility] RN [phone] from [facility].  CSW to continue to follow patient's progress throughout discharge planning.   Expected Discharge Plan: Assisted Living Barriers to Discharge: Continued Medical Work up, Unsafe home situation   Patient Goals and CMS Choice Patient states their goals for this hospitalization and ongoing recovery are:: To go to ALF CMS Medicare.gov Compare Post Acute Care list provided to:: Patient Represenative (must comment) Choice offered to / list presented to : Adult Children  Expected Discharge Plan and Services Expected Discharge Plan: Assisted Living     Post Acute Care Choice:  [facility]) Living arrangements for the past 2 months: Independent Living Facility Expected Discharge Date: 12/05/20                                    Prior Living Arrangements/Services Living arrangements for the past 2 months:  Independent Living Facility Lives with:: Self Patient language and need for interpreter reviewed:: Yes Do you feel safe going back to the place where you live?: No   Patient will be transitioning to ALF from Ind living.  Need for Family Participation in Patient Care: Yes (Comment) Care giver support system in place?: No (comment)   Criminal Activity/Legal Involvement Pertinent to Current Situation/Hospitalization: No - Comment as needed  Activities of Daily Living Home Assistive Devices/Equipment: Other (Comment) (unknown; patient poor historian) ADL Screening (condition at time of admission) Patient's cognitive ability adequate to safely complete daily activities?: No Is the patient deaf or have difficulty hearing?: No Does the patient have difficulty seeing, even when wearing glasses/contacts?: No Does the patient have difficulty concentrating, remembering, or making decisions?: Yes Patient able to express need for assistance with ADLs?: Yes Does the patient have difficulty dressing or bathing?: Yes Independently performs ADLs?: Yes (appropriate for developmental age) Does the patient have difficulty walking or climbing stairs?: No Weakness of Legs: Both Weakness of Arms/Hands: Both  Permission Sought/Granted Permission sought to share information with : Facility Medical sales representative, Family Supports, Case Manager Permission granted to share information with : Yes, Verbal Permission Granted  Share Information with NAME: Ihor Dow Daughter   [phone]           Emotional Assessment Appearance:: Appears  stated age   Affect (typically observed): Accepting, Appropriate, Calm Orientation: : Oriented to Self, Oriented to Place Alcohol / Substance Use: Not Applicable Psych Involvement: No (comment)  Admission diagnosis:  Acute UTI [N39.0] Complicated UTI (urinary tract infection) [N39.0] Sepsis with encephalopathy without septic shock, due to unspecified organism  (HCC) [A41.9, R65.20, G93.40] Patient Active Problem List   Diagnosis Date Noted   Sepsis secondary to UTI (HCC) 12/03/2020   Acute metabolic encephalopathy 12/03/2020   Chronic kidney disease, stage 3b (HCC) 12/03/2020   Type 2 diabetes mellitus with stage 3b chronic kidney disease, with long-term current use of insulin (HCC) 12/03/2020   Mixed diabetic hyperlipidemia associated with type 2 diabetes mellitus (HCC) 12/03/2020   Diabetic polyneuropathy associated with type 2 diabetes mellitus (HCC) 12/03/2020   Gastroesophageal reflux disease without esophagitis 12/03/2020   Dementia (HCC) 10/25/2020   Coronary artery disease involving native heart without angina pectoris 10/22/2017   PCP:  Annita Brod, MD Pharmacy:   [facility] [phone] - [place], [place] - [address] [place] [address] Phone: [phone] Fax: [phone]     Social Determinants of Health (SDOH) Interventions    Readmission Risk Interventions No flowsheet data found.

## 2020-12-06 DIAGNOSIS — A419 Sepsis, unspecified organism: Secondary | ICD-10-CM | POA: Diagnosis not present

## 2020-12-06 DIAGNOSIS — N390 Urinary tract infection, site not specified: Secondary | ICD-10-CM | POA: Diagnosis not present

## 2020-12-06 LAB — BASIC METABOLIC PANEL
Anion gap: 9 (ref 5–15)
BUN: 17 mg/dL (ref 8–23)
CO2: 23 mmol/L (ref 22–32)
Calcium: 9.2 mg/dL (ref 8.9–10.3)
Chloride: 108 mmol/L (ref 98–111)
Creatinine, Ser: 1.6 mg/dL — ABNORMAL HIGH (ref 0.61–1.24)
GFR, Estimated: 47 mL/min — ABNORMAL LOW (ref >60)
Glucose, Bld: 72 mg/dL (ref 70–99)
Potassium: 3.2 mmol/L — ABNORMAL LOW (ref 3.5–5.1)
Sodium: 140 mmol/L (ref 135–145)

## 2020-12-06 LAB — GLUCOSE, CAPILLARY
Glucose-Capillary: 86 mg/dL (ref 70–99)
Glucose-Capillary: 128 mg/dL — ABNORMAL HIGH (ref 70–99)
Glucose-Capillary: 119 mg/dL — ABNORMAL HIGH (ref 70–99)
Glucose-Capillary: 152 mg/dL — ABNORMAL HIGH (ref 70–99)

## 2020-12-06 LAB — CBC
HCT: 29.8 % — ABNORMAL LOW (ref 39.0–52.0)
Hemoglobin: 9.8 g/dL — ABNORMAL LOW (ref 13.0–17.0)
MCH: 30.2 pg (ref 26.0–34.0)
MCHC: 32.9 g/dL (ref 30.0–36.0)
MCV: 91.7 fL (ref 80.0–100.0)
Platelets: 164 10*3/uL (ref 150–400)
RBC: 3.25 MIL/uL — ABNORMAL LOW (ref 4.22–5.81)
RDW: 15.1 % (ref 11.5–15.5)
WBC: 7.9 10*3/uL (ref 4.0–10.5)
nRBC: 0 % (ref 0.0–0.2)

## 2020-12-06 LAB — SARS CORONAVIRUS 2 (TAT 6-24 HRS): SARS Coronavirus 2: NEGATIVE

## 2020-12-06 MED ORDER — POTASSIUM CHLORIDE CRYS ER 20 MEQ PO TBCR
40.0000 meq | EXTENDED_RELEASE_TABLET | Freq: Two times a day (BID) | ORAL | Status: DC
  Administered 2020-12-06 – 2020-12-07 (×3): 40 meq via ORAL
  Filled 2020-12-06 (×3): qty 2

## 2020-12-06 MED ORDER — HALOPERIDOL 2 MG PO TABS
2.0000 mg | ORAL_TABLET | Freq: Two times a day (BID) | ORAL | Status: DC | PRN
  Administered 2020-12-06: 2 mg via ORAL
  Filled 2020-12-06 (×3): qty 1

## 2020-12-06 NOTE — Care Management Important Message (Signed)
Important Message  Patient Details IM Letter given to the Patient. Name: Hurschel Paynter MRN: [id number] Date of Birth: August 20, 1950   Medicare Important Message Given:  Yes     Caren Macadam 12/06/2020, 12:42 PM

## 2020-12-06 NOTE — TOC Progression Note (Addendum)
Transition of Care [facility]) - Progression Note    Patient Details  Name: Anthony Morse MRN: [id number] Date of Birth: May 14, 1951  Transition of [facility]) CM/SW Contact  Yisell Sprunger, Olegario Messier, RN Phone Number: 12/06/2020, 9:33 AM  Clinical Narrative: Sherron Monday to [facility]-she will review the fl2 today & let me know if any additional info needed,she has received the d/c summary-will need updated progress note on Wednesday @ d/c. Dtr [name] will have own transport to facility.MD updated.      Expected Discharge Plan: Assisted Living Barriers to Discharge: Continued Medical Work up, Unsafe home situation  Expected Discharge Plan and Services Expected Discharge Plan: Assisted Living     Post Acute Care Choice:  [name]) Living arrangements for the past 2 months: Independent Living Facility Expected Discharge Date: 12/05/20                                     Social Determinants of Health (SDOH) Interventions    Readmission Risk Interventions No flowsheet data found.

## 2020-12-06 NOTE — Progress Notes (Signed)
PROGRESS NOTE    Anthony Morse  ZES:[id number] DOB: 1951/03/10 DOA: 12/02/2020 PCP: Annita Brod, MD    Brief Narrative:  70 year old gentleman has a history of type 2 diabetes on insulin, coronary artery disease, CKD stage IIIb, GERD and obstructive sleep apnea who is recently being worked up for Lewy body dementia was brought to the emergency room with weakness and lethargy.  On presentation he was found with leukocytosis, fever, tachycardia and tachypnea.  UA was suggestive of infection.  Treated with fluid resuscitation and started on Rocephin.  Temperature was 101.9.  CT scan of the abdomen pelvis with no evidence of urinary obstruction.  Assessment & Plan:   Principal Problem:   Sepsis secondary to UTI [facility]) Active Problems:   Coronary artery disease involving native heart without angina pectoris   Dementia (HCC)   Acute metabolic encephalopathy   Chronic kidney disease, stage 3b (HCC)   Type 2 diabetes mellitus with stage 3b chronic kidney disease, with long-term current use of insulin (HCC)   Mixed diabetic hyperlipidemia associated with type 2 diabetes mellitus (HCC)   Diabetic polyneuropathy associated with type 2 diabetes mellitus (HCC)   Gastroesophageal reflux disease without esophagitis  Sepsis present on admission secondary to E. coli UTI: Blood cultures negative.  Urine culture with E. coli sensitive to cephalosporins.  Treated with Rocephin for 4 days.  CT scan with no evidence of urological complications.  No urinary retention noted.  Will treat as complicated UTI with 10 days of antibiotics, Keflex 500 mg 3 times a day for 6 more days.   Acute on chronic kidney disease stage IIIb: He does have some baseline chronic kidney disease.  Resuscitated with fluid and stabilized.  Creatinine is 1.59. Patient was recently diagnosed with minimal-change disease and currently remains on tapering dose of steroids.  He is currently on prednisone 10 mg and followed by nephrologist.   Plan is to cut down his steroids to 5 mg and start on tacrolimus.  Outpatient follow-up.   Essential hypertension: Blood pressures are stable.  Patient is frequently getting acute renal failure.  We will hold off on his Lasix and losartan until follow-up visits with nephrology.   Acute metabolic encephalopathy with history of possible Lewy body dementia: Needs fall precautions and delirium precautions.  Had used occasional doses of Haldol in the hospital.  Currently adequately stabilized. Patient has developed sundowning and delirium at night and responded well to melatonin and Haldol. For his symptom management, will prescribe melatonin 3 mg at night, haloperidol 2 mg at night is scheduled.  Haloperidol 2 mg twice a day as needed for agitation. He has been followed by neuropsychiatry for management and work-up in progress.  Type 2 diabetes: Fairly controlled.  On 70/30 insulin usually takes 20 units twice a day at home.  Will resume.   Disposition: Patient from independent living facility.  He has required more supervision and occasionally developed delirium in the hospital setting.  He is needing more support.  Has been approved to go to assisted living facility at [facility]. -Chest x-ray was done for screening and is without any evidence of tuberculosis. -His behavior is well controlled on current medications to transfer to assisted living level of care.   DVT prophylaxis: enoxaparin (LOVENOX) injection 40 mg Start: 12/06/20 0800   Code Status: Full code Family Communication: Daughter on the phone 7/18 Disposition Plan: Status is: Inpatient  Remains inpatient appropriate because:Unsafe d/c plan  Dispo: The patient is from: Home  Anticipated d/c is to: ALF              Patient currently is medically stable to d/c.   Difficult to place patient No         Consultants:  None  Procedures:  None  Antimicrobials:  Rocephin 7/15-7/18 Keflex  7/19---   Subjective: Patient seen and examined.  Overnight given his scheduled dose of melatonin and Haldol and he slept well.  Patient himself tells me that he feels okay and he is mostly oriented today.  Reportedly more confusion and impulsiveness by evening and nights.  Objective: Vitals:   12/05/20 0411 12/05/20 1343 12/05/20 2007 12/06/20 0652  BP: 124/78 126/75 119/78 (!) 144/88  Pulse: 95 100 (!) 107 (!) 107  Resp: 20 18 20 17   Temp: 98.4 F (36.9 C) 98.2 F (36.8 C) 97.7 F (36.5 C) (!) 97.4 F (36.3 C)  TempSrc:  Oral Oral Oral  SpO2: 100% 98% 98% 99%  Weight:      Height:        Intake/Output Summary (Last 24 hours) at 12/06/2020 1018 Last data filed at 12/05/2020 1247 Gross per 24 hour  Intake 240 ml  Output --  Net 240 ml   Filed Weights   12/02/20 2056  Weight: 95.3 kg    Examination:  General exam: Appears calm and comfortable  Slightly impulsive and anxious.  Alert oriented x2-3.  Eating breakfast. Respiratory system: Clear to auscultation. Respiratory effort normal. Cardiovascular system: S1 & S2 heard, RRR. No JVD, murmurs, rubs, gallops or clicks. No pedal edema. Gastrointestinal system: Abdomen is nondistended, soft and nontender. No organomegaly or masses felt. Normal bowel sounds heard. Central nervous system: Alert and oriented. No focal neurological deficits. Extremities: Symmetric 5 x 5 power. Skin: No rashes, lesions or ulcers Psychiatry: Judgement and insight appear normal. Mood & affect flat and anxious.    Data Reviewed: I have personally reviewed following labs and imaging studies  CBC: Recent Labs  Lab 12/02/20 2120 12/03/20 0340 12/04/20 0520 12/05/20 0423 12/06/20 0425  WBC 15.5* 15.3* 16.8* 11.7* 7.9  NEUTROABS 13.3* 12.8*  --   --   --   HGB 10.6* 10.2* 10.5* 10.5* 9.8*  HCT 31.4* 30.5* 31.4* 31.8* 29.8*  MCV 90.0 90.2 90.0 91.6 91.7  PLT 147* 130* 144* 151 164   Basic Metabolic Panel: Recent Labs  Lab 12/02/20 2120  12/03/20 0340 12/04/20 0520 12/05/20 0423 12/06/20 0425  NA 136 138 139 140 140  K 3.5 3.8 3.4* 3.9 3.2*  CL 103 105 106 109 108  CO2 25 26 23  21* 23  GLUCOSE 153* 153* 79 86 72  BUN 34* 29* 24* 19 17  CREATININE 2.80* 2.22* 1.62* 1.59* 1.60*  CALCIUM 8.9 8.8* 9.2 9.1 9.2  MG  --  2.1  --   --   --    GFR: Estimated Creatinine Clearance: 52.8 mL/min (A) (by C-G formula based on SCr of 1.6 mg/dL (H)). Liver Function Tests: Recent Labs  Lab 12/02/20 2120 12/03/20 0340  AST 21 18  ALT 33 28  ALKPHOS 25* 25*  BILITOT 1.1 1.3*  PROT 6.2* 5.9*  ALBUMIN 3.8 3.5   No results for input(s): LIPASE, AMYLASE in the last 168 hours. No results for input(s): AMMONIA in the last 168 hours. Coagulation Profile: Recent Labs  Lab 12/02/20 2120 12/03/20 0340  INR 1.0 1.0   Cardiac Enzymes: No results for input(s): CKTOTAL, CKMB, CKMBINDEX, TROPONINI in the last 168 hours. BNP (last  3 results) No results for input(s): PROBNP in the last 8760 hours. HbA1C: No results for input(s): HGBA1C in the last 72 hours. CBG: Recent Labs  Lab 12/05/20 0736 12/05/20 1206 12/05/20 1607 12/05/20 2004 12/06/20 0717  GLUCAP 135* 86 169* 70 86   Lipid Profile: No results for input(s): CHOL, HDL, LDLCALC, TRIG, CHOLHDL, LDLDIRECT in the last 72 hours. Thyroid Function Tests: No results for input(s): TSH, T4TOTAL, FREET4, T3FREE, THYROIDAB in the last 72 hours. Anemia Panel: No results for input(s): VITAMINB12, FOLATE, FERRITIN, TIBC, IRON, RETICCTPCT in the last 72 hours. Sepsis Labs: Recent Labs  Lab 12/02/20 2120 12/03/20 0340  PROCALCITON  --  1.07  LATICACIDVEN 1.8  --     Recent Results (from the past 240 hour(s))  Blood culture (routine single)     Status: None (Preliminary result)   Collection Time: 12/02/20  9:15 PM   Specimen: BLOOD  Result Value Ref Range Status   Specimen Description   Final    BLOOD RIGHT ANTECUBITAL Performed at [facility], [address] [place], [place] [address]    Special Requests   Final    BOTTLES DRAWN AEROBIC AND ANAEROBIC Blood Culture results may not be optimal due to an excessive volume of blood received in culture bottles Performed at [facility], [address], [place], [place] [address]    Culture   Final    NO GROWTH 3 DAYS Performed at [facility], [address], [place], [place] [address]    Report Status PENDING  Incomplete  Urine Culture     Status: Abnormal   Collection Time: 12/02/20  9:18 PM   Specimen: In/Out Cath Urine  Result Value Ref Range Status   Specimen Description   Final    IN/OUT CATH URINE Performed at [facility], [address] [place], [place] [address]    Special Requests   Final    NONE Performed at [facility], [address], [place], [place] [address]    Culture >=100,000 COLONIES/mL ESCHERICHIA COLI (A)  Final   Report Status 12/05/2020 FINAL  Final   Organism ID, Bacteria ESCHERICHIA COLI (A)  Final      Susceptibility   Escherichia coli - MIC*    AMPICILLIN >=32 RESISTANT Resistant     CEFAZOLIN 16 SENSITIVE Sensitive     CEFEPIME <=0.12 SENSITIVE Sensitive     CEFTRIAXONE <=0.25 SENSITIVE Sensitive     CIPROFLOXACIN <=0.25 SENSITIVE Sensitive     GENTAMICIN <=1 SENSITIVE Sensitive     IMIPENEM <=0.25 SENSITIVE Sensitive     NITROFURANTOIN 32 SENSITIVE Sensitive     TRIMETH/SULFA >=320 RESISTANT Resistant     AMPICILLIN/SULBACTAM >=32 RESISTANT Resistant     PIP/TAZO 8 SENSITIVE Sensitive     * >=100,000 COLONIES/mL ESCHERICHIA COLI  Resp Panel by RT-PCR (Flu A&B, Covid) Nasopharyngeal Swab     Status: None   Collection Time: 12/02/20  9:34 PM   Specimen: Nasopharyngeal Swab; Nasopharyngeal(NP) swabs in vial transport medium  Result Value Ref Range Status   SARS Coronavirus 2 by RT PCR NEGATIVE NEGATIVE Final    Comment: (NOTE) SARS-CoV-2 target nucleic acids are NOT  DETECTED.  The SARS-CoV-2 RNA is generally detectable in upper respiratory specimens during the acute phase of infection. The lowest concentration of SARS-CoV-2 viral copies this assay can detect is 138 copies/mL. A negative result does not preclude SARS-Cov-2 infection and should not be used as the sole basis  for treatment or other patient management decisions. A negative result may occur with  improper specimen collection/handling, submission of specimen other than nasopharyngeal swab, presence of viral mutation(s) within the areas targeted by this assay, and inadequate number of viral copies(<138 copies/mL). A negative result must be combined with clinical observations, patient history, and epidemiological information. The expected result is Negative.  Fact Sheet for Patients:  BloggerCourse.comhttps://www.fda.gov/media/152166/download  Fact Sheet for Healthcare Providers:  SeriousBroker.ithttps://www.fda.gov/media/152162/download  This test is no t yet approved or cleared by the [place] FDA and  has been authorized for detection and/or diagnosis of SARS-CoV-2 by FDA under an Emergency Use Authorization (EUA). This EUA will remain  in effect (meaning this test can be used) for the duration of the COVID-19 declaration under Section 564(b)(1) of the Act, 21 U.S.C.section 360bbb-3(b)(1), unless the authorization is terminated  or revoked sooner.       Influenza A by PCR NEGATIVE NEGATIVE Final   Influenza B by PCR NEGATIVE NEGATIVE Final    Comment: (NOTE) The Xpert Xpress SARS-CoV-2/FLU/RSV plus assay is intended as an aid in the diagnosis of influenza from Nasopharyngeal swab specimens and should not be used as a sole basis for treatment. Nasal washings and aspirates are unacceptable for Xpert Xpress SARS-CoV-2/FLU/RSV testing.  Fact Sheet for Patients: BloggerCourse.comhttps://www.fda.gov/media/152166/download  Fact Sheet for Healthcare Providers: SeriousBroker.ithttps://www.fda.gov/media/152162/download  This test is not yet  approved or cleared by the [place] FDA and has been authorized for detection and/or diagnosis of SARS-CoV-2 by FDA under an Emergency Use Authorization (EUA). This EUA will remain in effect (meaning this test can be used) for the duration of the COVID-19 declaration under Section 564(b)(1) of the Act, 21 U.S.C. section 360bbb-3(b)(1), unless the authorization is terminated or revoked.  Performed at [facility], [address] [place], [place] [address]   SARS CORONAVIRUS 2 (TAT 6-24 HRS) Nasopharyngeal Nasopharyngeal Swab     Status: None   Collection Time: 12/05/20  4:29 PM   Specimen: Nasopharyngeal Swab  Result Value Ref Range Status   SARS Coronavirus 2 NEGATIVE NEGATIVE Final    Comment: (NOTE) SARS-CoV-2 target nucleic acids are NOT DETECTED.  The SARS-CoV-2 RNA is generally detectable in upper and lower respiratory specimens during the acute phase of infection. Negative results do not preclude SARS-CoV-2 infection, do not rule out co-infections with other pathogens, and should not be used as the sole basis for treatment or other patient management decisions. Negative results must be combined with clinical observations, patient history, and epidemiological information. The expected result is Negative.  Fact Sheet for Patients: HairSlick.nohttps://www.fda.gov/media/138098/download  Fact Sheet for Healthcare Providers: quierodirigir.comhttps://www.fda.gov/media/138095/download  This test is not yet approved or cleared by the [place] FDA and  has been authorized for detection and/or diagnosis of SARS-CoV-2 by FDA under an Emergency Use Authorization (EUA). This EUA will remain  in effect (meaning this test can be used) for the duration of the COVID-19 declaration under Se ction 564(b)(1) of the Act, 21 U.S.C. section 360bbb-3(b)(1), unless the authorization is terminated or revoked sooner.  Performed at [facility], [address] [place], [place] [address]           Radiology Studies: DG Chest 2 View  Result Date: 12/05/2020 CLINICAL DATA:  TB screening EXAM: CHEST - 2 VIEW COMPARISON:  12/02/2020 FINDINGS: Post sternotomy changes. Hazy atelectasis left base. No acute consolidation, pleural effusion or pneumothorax. Aortic atherosclerosis. IMPRESSION: No active cardiopulmonary disease.  Hazy atelectasis left base Electronically Signed  By: Jasmine Pang M.D.   On: 12/05/2020 18:37        Scheduled Meds:  cephALEXin  500 mg Oral Q8H   enoxaparin (LOVENOX) injection  40 mg Subcutaneous Q24H   haloperidol  2 mg Oral QHS   insulin aspart  0-15 Units Subcutaneous TID AC & HS   insulin aspart protamine- aspart  20 Units Subcutaneous BID WC   melatonin  3 mg Oral QHS   midodrine  5 mg Oral BID   pantoprazole  40 mg Oral Daily   potassium chloride  40 mEq Oral BID   predniSONE  10 mg Oral Daily   rosuvastatin  40 mg Oral Daily   Continuous Infusions:   LOS: 3 days    Time spent: 32 minutes    Dorcas Carrow, MD [facility] Pager [phone]

## 2020-12-06 NOTE — [place] FL2 (Signed)
[place] MEDICAID FL2 LEVEL OF CARE SCREENING TOOL     IDENTIFICATION  Patient Name: Anthony Morse Birthdate: 1951-03-17 Sex: male Admission Date (Current Location): 12/02/2020  [facility] and [place] Number:  Producer, television/film/video and Address:  [facility],  [address] [place], [place] [address]      Provider Number: [id number]  Attending Physician Name and Address:  Dorcas Carrow, MD  Relative Name and Phone Number:  Ihor Dow Daughter   [phone]    Current Level of Care: Hospital Recommended Level of Care: Assisted Living Facility Prior Approval Number:    Date Approved/Denied:   PASRR Number:    Discharge Plan: Domiciliary (Rest home) [facility])    Current Diagnoses: Patient Active Problem List   Diagnosis Date Noted   Sepsis secondary to UTI (HCC) 12/03/2020   Acute metabolic encephalopathy 12/03/2020   Chronic kidney disease, stage 3b (HCC) 12/03/2020   Type 2 diabetes mellitus with stage 3b chronic kidney disease, with long-term current use of insulin (HCC) 12/03/2020   Mixed diabetic hyperlipidemia associated with type 2 diabetes mellitus (HCC) 12/03/2020   Diabetic polyneuropathy associated with type 2 diabetes mellitus (HCC) 12/03/2020   Gastroesophageal reflux disease without esophagitis 12/03/2020   Dementia (HCC) 10/25/2020   Coronary artery disease involving native heart without angina pectoris 10/22/2017    Orientation RESPIRATION BLADDER Height & Weight     Self, Place  Normal Continent Weight: 95.3 kg Height:  5\' 11"  (180.3 cm)  BEHAVIORAL SYMPTOMS/MOOD NEUROLOGICAL BOWEL NUTRITION STATUS      Continent Diet (Regular)  AMBULATORY STATUS COMMUNICATION OF NEEDS Skin   Supervision Verbally Normal                       Personal Care Assistance Level of Assistance  Bathing, Feeding, Dressing Bathing Assistance: Limited assistance Feeding assistance: Independent Dressing Assistance: Limited assistance      Functional Limitations Info  Sight, Speech, Hearing Sight Info: Adequate Hearing Info: Adequate Speech Info: Adequate    SPECIAL CARE FACTORS FREQUENCY                       Contractures Contractures Info: Not present    Additional Factors Info  Code Status, Allergies, Insulin Sliding Scale Code Status Info: Full Code Allergies Info: Tramadol   Insulin Sliding Scale Info: insulin aspart (novoLOG) injection 0-15 Units 3x a day with meals       Current Medications (12/06/2020):  This is the current hospital active medication list Current Facility-Administered Medications  Medication Dose Route Frequency Provider Last Rate Last Admin   acetaminophen (TYLENOL) tablet 650 mg  650 mg Oral Q6H PRN 12/08/2020, MD   650 mg at 12/04/20 2253   Or   acetaminophen (TYLENOL) suppository 650 mg  650 mg Rectal Q6H PRN Shalhoub, 2254, MD       cephALEXin (KEFLEX) capsule 500 mg  500 mg Oral Q8H Deno Lunger, MD   500 mg at 12/06/20 0650   enoxaparin (LOVENOX) injection 40 mg  40 mg Subcutaneous Q24H 12/08/20, RPH   40 mg at 12/06/20 0818   haloperidol (HALDOL) tablet 2 mg  2 mg Oral QHS 12/08/20, MD   2 mg at 12/05/20 2056   haloperidol (HALDOL) tablet 2 mg  2 mg Oral Q12H PRN 2057, MD       insulin aspart (novoLOG) injection 0-15 Units  0-15 Units Subcutaneous TID AC & HS Shalhoub,  Deno Lunger, MD   3 Units at 12/05/20 1735   insulin aspart protamine- aspart (NOVOLOG MIX 70/30) injection 20 Units  20 Units Subcutaneous BID WC Shalhoub, Deno Lunger, MD   20 Units at 12/06/20 0818   melatonin tablet 3 mg  3 mg Oral QHS Dorcas Carrow, MD   3 mg at 12/05/20 2057   midodrine (PROAMATINE) tablet 5 mg  5 mg Oral BID Marinda Elk, MD   5 mg at 12/06/20 0817   ondansetron (ZOFRAN) tablet 4 mg  4 mg Oral Q6H PRN Marinda Elk, MD       Or   ondansetron [facility]) injection 4 mg  4 mg Intravenous Q6H PRN Shalhoub, Deno Lunger, MD       pantoprazole (PROTONIX)  EC tablet 40 mg  40 mg Oral Daily Shalhoub, Deno Lunger, MD   40 mg at 12/06/20 0818   polyethylene glycol (MIRALAX / GLYCOLAX) packet 17 g  17 g Oral Daily PRN Shalhoub, Deno Lunger, MD       potassium chloride SA (KLOR-CON) CR tablet 40 mEq  40 mEq Oral BID Dorcas Carrow, MD   40 mEq at 12/06/20 0817   predniSONE (DELTASONE) tablet 10 mg  10 mg Oral Daily Marinda Elk, MD   10 mg at 12/06/20 0817   rosuvastatin (CRESTOR) tablet 40 mg  40 mg Oral Daily Marinda Elk, MD   40 mg at 12/06/20 0818     Discharge Medications: Please see discharge summary for a list of discharge medications.  Relevant Imaging Results:  Relevant Lab Results:   Additional Information SSN [id number]  Lanier Clam, RN

## 2020-12-07 DIAGNOSIS — A419 Sepsis, unspecified organism: Secondary | ICD-10-CM | POA: Diagnosis not present

## 2020-12-07 DIAGNOSIS — N390 Urinary tract infection, site not specified: Secondary | ICD-10-CM | POA: Diagnosis not present

## 2020-12-07 LAB — GLUCOSE, CAPILLARY
Glucose-Capillary: 86 mg/dL (ref 70–99)
Glucose-Capillary: 111 mg/dL — ABNORMAL HIGH (ref 70–99)

## 2020-12-07 MED ORDER — HALOPERIDOL 2 MG PO TABS: 2.0000 mg | ORAL_TABLET | Freq: Every day | ORAL | 0 refills | Status: AC

## 2020-12-07 MED ORDER — MELATONIN 3 MG PO TABS: 3.0000 mg | ORAL_TABLET | Freq: Every day | ORAL | 0 refills | Status: AC

## 2020-12-07 MED ORDER — HALOPERIDOL 2 MG PO TABS: 2.0000 mg | ORAL_TABLET | Freq: Two times a day (BID) | ORAL | 0 refills | Status: AC | PRN

## 2020-12-07 NOTE — Plan of Care (Signed)
  Problem: Education: Goal: Knowledge of General Education information will improve Description: Including pain rating scale, medication(s)/side effects and non-pharmacologic comfort measures 12/07/2020 1417 by Mariann Laster, RN Outcome: Adequate for Discharge 12/07/2020 1244 by Mariann Laster, RN Outcome: Not Progressing   Problem: Health Behavior/Discharge Planning: Goal: Ability to manage health-related needs will improve Outcome: Adequate for Discharge   Problem: Clinical Measurements: Goal: Ability to maintain clinical measurements within normal limits will improve Outcome: Adequate for Discharge Goal: Will remain free from infection 12/07/2020 1417 by Mariann Laster, RN Outcome: Adequate for Discharge 12/07/2020 1244 by Mariann Laster, RN Outcome: Progressing Goal: Diagnostic test results will improve Outcome: Adequate for Discharge Goal: Respiratory complications will improve Outcome: Adequate for Discharge Goal: Cardiovascular complication will be avoided Outcome: Adequate for Discharge   Problem: Activity: Goal: Risk for activity intolerance will decrease Outcome: Adequate for Discharge   Problem: Nutrition: Goal: Adequate nutrition will be maintained Outcome: Adequate for Discharge   Problem: Coping: Goal: Level of anxiety will decrease Outcome: Adequate for Discharge   Problem: Elimination: Goal: Will not experience complications related to bowel motility Outcome: Adequate for Discharge Goal: Will not experience complications related to urinary retention Outcome: Adequate for Discharge   Problem: Pain Managment: Goal: General experience of comfort will improve Outcome: Adequate for Discharge   Problem: Safety: Goal: Ability to remain free from injury will improve Outcome: Adequate for Discharge   Problem: Skin Integrity: Goal: Risk for impaired skin integrity will decrease Outcome: Adequate for Discharge

## 2020-12-07 NOTE — Progress Notes (Signed)
PROGRESS NOTE    Anthony Morse  KZS:[id number] DOB: Apr 21, 1951 DOA: 12/02/2020 PCP: Annita Brod, MD    Brief Narrative:  70 year old gentleman has a history of type 2 diabetes on insulin, coronary artery disease, CKD stage IIIb, GERD and obstructive sleep apnea who is recently being worked up for Lewy body dementia was brought to the emergency room with weakness and lethargy.  On presentation he was found with leukocytosis, fever, tachycardia and tachypnea.  UA was suggestive of infection.  Treated with fluid resuscitation and started on Rocephin.  Temperature was 101.9.  CT scan of the abdomen pelvis with no evidence of urinary obstruction.  Assessment & Plan:   Principal Problem:   Sepsis secondary to UTI [facility]) Active Problems:   Coronary artery disease involving native heart without angina pectoris   Dementia (HCC)   Acute metabolic encephalopathy   Chronic kidney disease, stage 3b (HCC)   Type 2 diabetes mellitus with stage 3b chronic kidney disease, with long-term current use of insulin (HCC)   Mixed diabetic hyperlipidemia associated with type 2 diabetes mellitus (HCC)   Diabetic polyneuropathy associated with type 2 diabetes mellitus (HCC)   Gastroesophageal reflux disease without esophagitis  Sepsis present on admission secondary to E. coli UTI: Blood cultures negative.  Urine culture with E. coli sensitive to cephalosporins.  Treated with Rocephin for 4 days.  CT scan with no evidence of urological complications.  No urinary retention noted.  Will treat as complicated UTI with 10 days of antibiotics, Keflex 500 mg 3 times a day for 6 more days.   Acute on chronic kidney disease stage IIIb: He does have some baseline chronic kidney disease.  Resuscitated with fluid and stabilized.  Creatinine is 1.59. Patient was recently diagnosed with minimal-change disease and currently remains on tapering dose of steroids.  He is currently on prednisone 10 mg and followed by nephrologist.   Plan is to cut down his steroids to 5 mg and start on tacrolimus.  Outpatient follow-up.   Essential hypertension: Blood pressures are stable.  resume Lasix and losartan today.  follow-up visits with nephrology.   Acute metabolic encephalopathy with history of possible Lewy body dementia: Needs fall precautions and delirium precautions.  Had used occasional doses of Haldol in the hospital.  Currently adequately stabilized. Patient has developed sundowning and delirium at night and responded well to melatonin and Haldol. For his symptom management, will prescribe melatonin 3 mg at night, haloperidol 2 mg at night is scheduled.  Haloperidol 2 mg twice a day as needed for agitation. He has been followed by neuropsychiatry for management and work-up in progress.  Type 2 diabetes: Fairly controlled.  On 70/30 insulin usually takes 20 units twice a day at home.  Will resume.   Disposition: Patient from independent living facility.  He has required more supervision and occasionally developed delirium in the hospital setting.  He is needing more support.  Has been approved to go to assisted living facility at [facility]. -Chest x-ray was done for screening and is without any evidence of tuberculosis. -His behavior is well controlled on current medications to transfer to assisted living level of care.   DVT prophylaxis: enoxaparin (LOVENOX) injection 40 mg Start: 12/06/20 0800   Code Status: Full code Family Communication: Daughter on the phone 7/19 Disposition Plan: Status is: Inpatient  Remains inpatient appropriate because:Unsafe d/c plan  Dispo: The patient is from: Home              Anticipated  d/c is to: ALF              Patient currently is medically stable to d/c.   Difficult to place patient No   Discharge today       Consultants:  None  Procedures:  None  Antimicrobials:  Rocephin 7/15-7/18 Keflex 7/19---   Subjective: No overnight events. Slept after receiving  Haldol and melatonin . Anxious part of the night   Objective: Vitals:   12/06/20 0652 12/06/20 1254 12/06/20 2213 12/07/20 0555  BP: (!) 144/88 (!) 160/80 (!) 162/96 (!) 144/93  Pulse: (!) 107 99 99 (!) 103  Resp: 17 20 20 20   Temp: (!) 97.4 F (36.3 C) 98.5 F (36.9 C) (!) 97.4 F (36.3 C) (!) 97.5 F (36.4 C)  TempSrc: Oral Axillary Oral Oral  SpO2: 99% 99% 97% 100%  Weight:      Height:        Intake/Output Summary (Last 24 hours) at 12/07/2020 0815 Last data filed at 12/06/2020 2112 Gross per 24 hour  Intake 200 ml  Output --  Net 200 ml   Filed Weights   12/02/20 2056  Weight: 95.3 kg    Examination:  Looks comfortable. Sitting in chair . Slightly anxious.     Data Reviewed: I have personally reviewed following labs and imaging studies  CBC: Recent Labs  Lab 12/02/20 2120 12/03/20 0340 12/04/20 0520 12/05/20 0423 12/06/20 0425  WBC 15.5* 15.3* 16.8* 11.7* 7.9  NEUTROABS 13.3* 12.8*  --   --   --   HGB 10.6* 10.2* 10.5* 10.5* 9.8*  HCT 31.4* 30.5* 31.4* 31.8* 29.8*  MCV 90.0 90.2 90.0 91.6 91.7  PLT 147* 130* 144* 151 164   Basic Metabolic Panel: Recent Labs  Lab 12/02/20 2120 12/03/20 0340 12/04/20 0520 12/05/20 0423 12/06/20 0425  NA 136 138 139 140 140  K 3.5 3.8 3.4* 3.9 3.2*  CL 103 105 106 109 108  CO2 25 26 23  21* 23  GLUCOSE 153* 153* 79 86 72  BUN 34* 29* 24* 19 17  CREATININE 2.80* 2.22* 1.62* 1.59* 1.60*  CALCIUM 8.9 8.8* 9.2 9.1 9.2  MG  --  2.1  --   --   --    GFR: Estimated Creatinine Clearance: 52.8 mL/min (A) (by C-G formula based on SCr of 1.6 mg/dL (H)). Liver Function Tests: Recent Labs  Lab 12/02/20 2120 12/03/20 0340  AST 21 18  ALT 33 28  ALKPHOS 25* 25*  BILITOT 1.1 1.3*  PROT 6.2* 5.9*  ALBUMIN 3.8 3.5   No results for input(s): LIPASE, AMYLASE in the last 168 hours. No results for input(s): AMMONIA in the last 168 hours. Coagulation Profile: Recent Labs  Lab 12/02/20 2120 12/03/20 0340  INR 1.0  1.0   Cardiac Enzymes: No results for input(s): CKTOTAL, CKMB, CKMBINDEX, TROPONINI in the last 168 hours. BNP (last 3 results) No results for input(s): PROBNP in the last 8760 hours. HbA1C: No results for input(s): HGBA1C in the last 72 hours. CBG: Recent Labs  Lab 12/06/20 0717 12/06/20 1157 12/06/20 1617 12/06/20 2040 12/07/20 0716  GLUCAP 86 128* 119* 152* 86   Lipid Profile: No results for input(s): CHOL, HDL, LDLCALC, TRIG, CHOLHDL, LDLDIRECT in the last 72 hours. Thyroid Function Tests: No results for input(s): TSH, T4TOTAL, FREET4, T3FREE, THYROIDAB in the last 72 hours. Anemia Panel: No results for input(s): VITAMINB12, FOLATE, FERRITIN, TIBC, IRON, RETICCTPCT in the last 72 hours. Sepsis Labs: Recent Labs  Lab 12/02/20 2120  12/03/20 0340  PROCALCITON  --  1.07  LATICACIDVEN 1.8  --     Recent Results (from the past 240 hour(s))  Blood culture (routine single)     Status: None (Preliminary result)   Collection Time: 12/02/20  9:15 PM   Specimen: BLOOD  Result Value Ref Range Status   Specimen Description   Final    BLOOD RIGHT ANTECUBITAL Performed at [facility], [address], [place], [place] [address]    Special Requests   Final    BOTTLES DRAWN AEROBIC AND ANAEROBIC Blood Culture results may not be optimal due to an excessive volume of blood received in culture bottles Performed at [facility], [address] [place], [place] [address]    Culture   Final    NO GROWTH 4 DAYS Performed at [facility], [address] [place], [place] [address]    Report Status PENDING  Incomplete  Urine Culture     Status: Abnormal   Collection Time: 12/02/20  9:18 PM   Specimen: In/Out Cath Urine  Result Value Ref Range Status   Specimen Description   Final    IN/OUT CATH URINE Performed at [facility], [address], [place], [place] [address]    Special Requests   Final    NONE Performed  at [facility], [address] [place], [place] [address]    Culture >=100,000 COLONIES/mL ESCHERICHIA COLI (A)  Final   Report Status 12/05/2020 FINAL  Final   Organism ID, Bacteria ESCHERICHIA COLI (A)  Final      Susceptibility   Escherichia coli - MIC*    AMPICILLIN >=32 RESISTANT Resistant     CEFAZOLIN 16 SENSITIVE Sensitive     CEFEPIME <=0.12 SENSITIVE Sensitive     CEFTRIAXONE <=0.25 SENSITIVE Sensitive     CIPROFLOXACIN <=0.25 SENSITIVE Sensitive     GENTAMICIN <=1 SENSITIVE Sensitive     IMIPENEM <=0.25 SENSITIVE Sensitive     NITROFURANTOIN 32 SENSITIVE Sensitive     TRIMETH/SULFA >=320 RESISTANT Resistant     AMPICILLIN/SULBACTAM >=32 RESISTANT Resistant     PIP/TAZO 8 SENSITIVE Sensitive     * >=100,000 COLONIES/mL ESCHERICHIA COLI  Resp Panel by RT-PCR (Flu A&B, Covid) Nasopharyngeal Swab     Status: None   Collection Time: 12/02/20  9:34 PM   Specimen: Nasopharyngeal Swab; Nasopharyngeal(NP) swabs in vial transport medium  Result Value Ref Range Status   SARS Coronavirus 2 by RT PCR NEGATIVE NEGATIVE Final    Comment: (NOTE) SARS-CoV-2 target nucleic acids are NOT DETECTED.  The SARS-CoV-2 RNA is generally detectable in upper respiratory specimens during the acute phase of infection. The lowest concentration of SARS-CoV-2 viral copies this assay can detect is 138 copies/mL. A negative result does not preclude SARS-Cov-2 infection and should not be used as the sole basis for treatment or other patient management decisions. A negative result may occur with  improper specimen collection/handling, submission of specimen other than nasopharyngeal swab, presence of viral mutation(s) within the areas targeted by this assay, and inadequate number of viral copies(<138 copies/mL). A negative result must be combined with clinical observations, patient history, and epidemiological information. The expected result is Negative.  Fact Sheet for Patients:   BloggerCourse.com  Fact Sheet for Healthcare Providers:  SeriousBroker.it  This test is no t yet approved or cleared by the [place] FDA and  has been authorized for detection and/or diagnosis of SARS-CoV-2 by FDA under an  Emergency Use Authorization (EUA). This EUA will remain  in effect (meaning this test can be used) for the duration of the COVID-19 declaration under Section 564(b)(1) of the Act, 21 U.S.C.section 360bbb-3(b)(1), unless the authorization is terminated  or revoked sooner.       Influenza A by PCR NEGATIVE NEGATIVE Final   Influenza B by PCR NEGATIVE NEGATIVE Final    Comment: (NOTE) The Xpert Xpress SARS-CoV-2/FLU/RSV plus assay is intended as an aid in the diagnosis of influenza from Nasopharyngeal swab specimens and should not be used as a sole basis for treatment. Nasal washings and aspirates are unacceptable for Xpert Xpress SARS-CoV-2/FLU/RSV testing.  Fact Sheet for Patients: BloggerCourse.com  Fact Sheet for Healthcare Providers: SeriousBroker.it  This test is not yet approved or cleared by the [place] FDA and has been authorized for detection and/or diagnosis of SARS-CoV-2 by FDA under an Emergency Use Authorization (EUA). This EUA will remain in effect (meaning this test can be used) for the duration of the COVID-19 declaration under Section 564(b)(1) of the Act, 21 U.S.C. section 360bbb-3(b)(1), unless the authorization is terminated or revoked.  Performed at [facility], [address], [place], [place] [address]   SARS CORONAVIRUS 2 (TAT 6-24 HRS) Nasopharyngeal Nasopharyngeal Swab     Status: None   Collection Time: 12/05/20  4:29 PM   Specimen: Nasopharyngeal Swab  Result Value Ref Range Status   SARS Coronavirus 2 NEGATIVE NEGATIVE Final    Comment: (NOTE) SARS-CoV-2 target nucleic acids are NOT  DETECTED.  The SARS-CoV-2 RNA is generally detectable in upper and lower respiratory specimens during the acute phase of infection. Negative results do not preclude SARS-CoV-2 infection, do not rule out co-infections with other pathogens, and should not be used as the sole basis for treatment or other patient management decisions. Negative results must be combined with clinical observations, patient history, and epidemiological information. The expected result is Negative.  Fact Sheet for Patients: HairSlick.no  Fact Sheet for Healthcare Providers: quierodirigir.com  This test is not yet approved or cleared by the [place] FDA and  has been authorized for detection and/or diagnosis of SARS-CoV-2 by FDA under an Emergency Use Authorization (EUA). This EUA will remain  in effect (meaning this test can be used) for the duration of the COVID-19 declaration under Se ction 564(b)(1) of the Act, 21 U.S.C. section 360bbb-3(b)(1), unless the authorization is terminated or revoked sooner.  Performed at [facility], [address], [place], [place] [address]          Radiology Studies: DG Chest 2 View  Result Date: 12/05/2020 CLINICAL DATA:  TB screening EXAM: CHEST - 2 VIEW COMPARISON:  12/02/2020 FINDINGS: Post sternotomy changes. Hazy atelectasis left base. No acute consolidation, pleural effusion or pneumothorax. Aortic atherosclerosis. IMPRESSION: No active cardiopulmonary disease.  Hazy atelectasis left base Electronically Signed   By: Jasmine Pang M.D.   On: 12/05/2020 18:37        Scheduled Meds:  cephALEXin  500 mg Oral Q8H   enoxaparin (LOVENOX) injection  40 mg Subcutaneous Q24H   haloperidol  2 mg Oral QHS   insulin aspart  0-15 Units Subcutaneous TID AC & HS   insulin aspart protamine- aspart  20 Units Subcutaneous BID WC   melatonin  3 mg Oral QHS   midodrine  5 mg Oral BID   pantoprazole  40 mg  Oral Daily   potassium chloride  40 mEq Oral BID   predniSONE  10  mg Oral Daily   rosuvastatin  40 mg Oral Daily   Continuous Infusions:   LOS: 4 days    Time spent: 25 minutes     Dorcas CarrowKuber Aleese Kamps, MD [facility] Pager [phone]

## 2020-12-07 NOTE — Plan of Care (Signed)
  Problem: Clinical Measurements: Goal: Will remain free from infection Outcome: Progressing   Problem: Education: Goal: Knowledge of General Education information will improve Description: Including pain rating scale, medication(s)/side effects and non-pharmacologic comfort measures Outcome: Not Progressing

## 2020-12-07 NOTE — Discharge Summary (Addendum)
Physician Discharge Summary  [facility] XWR:[id number] DOB: 24-Jul-1950 DOA: 12/02/2020  PCP: Anthony Brod, MD  Admit date: 12/02/2020 Discharge date: 12/07/2020  Admitted From: Independent living place Disposition: Independent living facility  Recommendations for Outpatient Follow-up:  Follow up with PCP in 1-2 weeks Please obtain BMP/CBC in one week 3. Schedule follow up with neurology and nephrology   Home Health: Not applicable Equipment/Devices: Not applicable  Discharge Condition: Stable CODE STATUS: Full code Diet recommendation: Low-carb diet  Discharge summary: 70 year-old gentleman has a history of type 2 diabetes on insulin, coronary artery disease, CKD stage IIIb, GERD and obstructive sleep apnea who is recently being worked up for Lewy body dementia was brought to the emergency room with weakness and lethargy.  On presentation he was found with leukocytosis, fever, tachycardia and tachypnea.  UA was suggestive of infection.  Treated with fluid resuscitation and started on Rocephin.  Temperature was 101.9.  CT scan of the abdomen pelvis with no evidence of urinary obstruction.  Sepsis present on admission secondary to [facility] UTI: Blood cultures negative.  Urine culture with [facility] sensitive to cephalosporins.  Treated with Rocephin, day 4 today.  CT scan with no evidence of urological complications.  No urinary retention noted.  Will treat as complicated UTI with 10 days of antibiotics, Keflex 500 mg 3 times a day for 6 more days.  Acute on chronic kidney disease stage IIIb: He does have some baseline chronic kidney disease.  Resuscitated with fluid and stabilized.  Creatinine is 1.59 today. Patient was recently diagnosed with minimal-change disease and currently remains on tapering dose of steroids.  He is currently on prednisone 10 mg and followed by nephrologist.  Plan is to cut down his steroids to 5 mg and start on tacrolimus.  Outpatient follow-up.  Essential  hypertension: Blood pressures are stable.  Resume his Lasix and losartan and schedule follow-up visits with nephrology.  Acute metabolic encephalopathy with history of possible Lewy body dementia: Needs fall precautions and delirium precautions.  Had used occasional doses of Haldol in the [facility].  Currently adequately stabilized. Patient has developed sundowning and delirium at night and responded well to melatonin and Haldol. For his symptom management, will prescribe melatonin 3 mg at night, haloperidol 2 mg at night is scheduled.  Haloperidol 2 mg twice a day as needed for agitation. He has been followed by neuropsychiatry for management and work-up in progress.   Type 2 diabetes: Fairly controlled.  On 70/30 insulin usually takes 20 units twice a day at home.  Will resume.   Disposition: Patient from independent living facility.  He has required more supervision and occasionally developed delirium in the [facility] setting.  He is needing more support.  Has been approved to go to assisted living facility at [facility]. -Chest x-ray was done for screening and is without any evidence of tuberculosis. -His behavior is well controlled on current medications to transfer to assisted living level of care.  Medically stable. Medication reviewed and updated. New prescriptions sent to pharmacy.   Addendum, medication clarification: Alirocumab is planned to start by cardiology. Please do not provide until seen by the provider.  Patient's family can provide their own [facility] free style blood glucose monitoring device.  Discharge Diagnoses:  Principal Problem:   Sepsis secondary to UTI [facility]) Active Problems:   Coronary artery disease involving native heart without angina pectoris   Dementia (HCC)   Acute metabolic encephalopathy   Chronic kidney disease, stage 3b (HCC)  Type 2 diabetes mellitus with stage 3b chronic kidney disease, with long-term current use of insulin (HCC)   Mixed diabetic  hyperlipidemia associated with type 2 diabetes mellitus (HCC)   Diabetic polyneuropathy associated with type 2 diabetes mellitus (HCC)   Gastroesophageal reflux disease without esophagitis    Discharge Instructions  Discharge Instructions     Diet - low sodium heart healthy   Complete by: As directed    Discharge instructions   Complete by: As directed    Hold off taking Losartan and Lasix until seen in follow up in office .  Take steroids and other medicine as prescribed by the Kidney doctor without any changes   Increase activity slowly   Complete by: As directed       Allergies as of 12/07/2020       Reactions   Tramadol    Delusions (intolerance), Hallucinations,        Medication List     STOP taking these medications    amoxicillin-clavulanate 875-125 MG tablet Commonly known as: AUGMENTIN   omeprazole 20 MG capsule Commonly known as: PRILOSEC       TAKE these medications    aspirin EC 81 MG tablet Take 81 mg by mouth daily. Swallow whole.   calcium carbonate 500 MG chewable tablet Commonly known as: TUMS - dosed in mg elemental calcium Chew 500 mg by mouth daily as needed for indigestion or heartburn.   cephALEXin 500 MG capsule Commonly known as: KEFLEX Take 1 capsule (500 mg total) by mouth 3 (three) times daily for 6 days.   fenofibrate micronized 130 MG capsule Commonly known as: ANTARA Take 130 mg by mouth daily before breakfast.   FreeStyle Libre 14 Day Sensor Misc Apply topically as directed.   furosemide 40 MG tablet Commonly known as: LASIX Take 40 mg by mouth.   gabapentin 100 MG capsule Commonly known as: NEURONTIN Take 100 mg by mouth 2 (two) times daily.   haloperidol 2 MG tablet Commonly known as: HALDOL Take 1 tablet (2 mg total) by mouth at bedtime.   haloperidol 2 MG tablet Commonly known as: HALDOL Take 1 tablet (2 mg total) by mouth every 12 (twelve) hours as needed for agitation.   insulin aspart protamine-  aspart (70-30) 100 UNIT/ML injection Commonly known as: NOVOLOG MIX 70/30 Inject 25-50 Units into the skin 2 (two) times daily with a meal.   losartan 25 MG tablet Commonly known as: COZAAR Take 25 mg by mouth daily.   melatonin 3 MG Tabs tablet Take 1 tablet (3 mg total) by mouth at bedtime.   midodrine 5 MG tablet Commonly known as: PROAMATINE Take 5 mg by mouth 2 (two) times daily.   niacin 500 MG tablet Take 500 mg by mouth at bedtime.   Praluent 75 MG/ML Soaj Generic drug: Alirocumab Inject 1 pen into the skin every 14 (fourteen) days.   predniSONE 10 MG tablet Commonly known as: DELTASONE Take 10 mg by mouth daily with breakfast.   rosuvastatin 40 MG tablet Commonly known as: CRESTOR Take 1 tablet (40 mg total) by mouth daily.   tacrolimus 0.5 MG capsule Commonly known as: PROGRAF Take 0.5 mg by mouth 2 (two) times daily.        Follow-up Information     Anthony Brod, MD Follow up in 2 week(s).   Specialty: Internal Medicine Contact information: [address] [place] [address] [phone]  Allergies  Allergen Reactions   Tramadol     Delusions (intolerance), Hallucinations,    Consultations: None.   Procedures/Studies: CT ABDOMEN PELVIS WO CONTRAST  Result Date: 12/03/2020 CLINICAL DATA:  Pyelonephritis EXAM: CT ABDOMEN AND PELVIS WITHOUT CONTRAST TECHNIQUE: Multidetector CT imaging of the abdomen and pelvis was performed following the standard protocol without IV contrast. COMPARISON:  None. FINDINGS: Lower chest: No acute abnormality. Hepatobiliary: No focal liver abnormality is seen. Status post cholecystectomy. No biliary dilatation. Pancreas: Unremarkable Spleen: Unremarkable Adrenals/Urinary Tract: A 23 mm macroscopic fat containing nodule is seen within the left adrenal gland in keeping with a benign adrenal myelolipoma. The right adrenal gland contains a 20 mm low-attenuation nodule most in keeping with benign  adrenal adenoma. The kidneys are normal in size and position. A 2 mm nonobstructing calculus is seen within the upper pole of the right kidney. No ureteral calculi. No hydronephrosis. No perinephric fluid collections are identified. The bladder is unremarkable. Stomach/Bowel: Mild sigmoid diverticulosis. The stomach, small bowel, and large bowel are otherwise unremarkable. Appendix absent. No free intraperitoneal gas or fluid. Vascular/Lymphatic: Moderate atherosclerotic calcification is seen within the aortoiliac vasculature. No aortic aneurysm identified. No pathologic adenopathy within the abdomen and pelvis. Reproductive: Uterus and bilateral adnexa are unremarkable. Other: No abdominal wall hernia.  The rectum is unremarkable. Musculoskeletal: No acute bone abnormality. No lytic or blastic bone lesion identified. IMPRESSION: No acute intra-abdominal pathology identified. No definite radiographic explanation for the patient's reported symptoms. Minimal nonobstructing right nephrolithiasis. No urolithiasis. No hydronephrosis. 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 Mild sigmoid diverticulosis. Aortic Atherosclerosis (ICD10-I70.0). Electronically Signed   By: Helyn Numbers MD   On: 12/03/2020 03:52   DG Chest 2 View  Result Date: 12/05/2020 CLINICAL DATA:  TB screening EXAM: CHEST - 2 VIEW COMPARISON:  12/02/2020 FINDINGS: Post sternotomy changes. Hazy atelectasis left base. No acute consolidation, pleural effusion or pneumothorax. Aortic atherosclerosis. IMPRESSION: No active cardiopulmonary disease.  Hazy atelectasis left base Electronically Signed   By: Jasmine Pang M.D.   On: 12/05/2020 18:37   DG Chest Port 1 View  Result Date: 12/02/2020 CLINICAL DATA:  Weakness altered mental status EXAM: PORTABLE CHEST 1 VIEW COMPARISON:  CT 10/03/2020 FINDINGS: Low lung volumes. Post sternotomy changes. Subsegmental atelectasis left base.  Exaggerated cardiomediastinal silhouette by low lung volume. Aortic atherosclerosis. No pneumothorax IMPRESSION: No active disease.  Low lung volumes Electronically Signed   By: Jasmine Pang M.D.   On: 12/02/2020 21:36   (Echo, Carotid, EGD, Colonoscopy, ERCP)    Subjective: no new events    Discharge Exam: Vitals:   12/06/20 2213 12/07/20 0555  BP: (!) 162/96 (!) 144/93  Pulse: 99 (!) 103  Resp: 20 20  Temp: (!) 97.4 F (36.3 C) (!) 97.5 F (36.4 C)  SpO2: 97% 100%   Vitals:   12/06/20 0652 12/06/20 1254 12/06/20 2213 12/07/20 0555  BP: (!) 144/88 (!) 160/80 (!) 162/96 (!) 144/93  Pulse: (!) 107 99 99 (!) 103  Resp: Temp: (!) 97.4 F (36.3 C) 98.5 F (36.9 C) (!) 97.4 F (36.3 C) (!) 97.5 F (36.4 C)  TempSrc: Oral Axillary Oral Oral  SpO2: 99% 99% 97% 100%  Weight:      Height:        General: Pt is alert, awake, not in acute distress Patient is alert oriented x2.  Pleasant.  Conversant. Cardiovascular: RRR, S1/S2 +, no rubs,  no gallops Respiratory: CTA bilaterally, no wheezing, no rhonchi Abdominal: Soft, NT, ND, bowel sounds + Extremities: no edema, no cyanosis    The results of significant diagnostics from this hospitalization (including imaging, microbiology, ancillary and laboratory) are listed below for reference.     Microbiology: Recent Results (from the past 240 hour(s))  Blood culture (routine single)     Status: None (Preliminary result)   Collection Time: 12/02/20  9:15 PM   Specimen: BLOOD  Result Value Ref Range Status   Specimen Description   Final    BLOOD RIGHT ANTECUBITAL Performed at [facility], [address] [place], [place] [address]    Special Requests   Final    BOTTLES DRAWN AEROBIC AND ANAEROBIC Blood Culture results may not be optimal due to an excessive volume of blood received in culture bottles Performed at [facility], [address], [place], [place] [address]     Culture   Final    NO GROWTH 4 DAYS Performed at [facility], [address], [place], [place] [address]    Report Status PENDING  Incomplete  Urine Culture     Status: Abnormal   Collection Time: 12/02/20  9:18 PM   Specimen: In/Out Cath Urine  Result Value Ref Range Status   Specimen Description   Final    IN/OUT CATH URINE Performed at [facility], [address] [place], [place] [address]    Special Requests   Final    NONE Performed at [facility], [address], [place], [place] [address]    Culture >=100,000 COLONIES/mL ESCHERICHIA [facility] (A)  Final   Report Status 12/05/2020 FINAL  Final   Organism ID, Bacteria ESCHERICHIA [facility] (A)  Final      Susceptibility   Escherichia [facility] - MIC*    AMPICILLIN >=32 RESISTANT Resistant     CEFAZOLIN 16 SENSITIVE Sensitive     CEFEPIME <=0.12 SENSITIVE Sensitive     CEFTRIAXONE <=0.25 SENSITIVE Sensitive     CIPROFLOXACIN <=0.25 SENSITIVE Sensitive     GENTAMICIN <=1 SENSITIVE Sensitive     IMIPENEM <=0.25 SENSITIVE Sensitive     NITROFURANTOIN 32 SENSITIVE Sensitive     TRIMETH/SULFA >=320 RESISTANT Resistant     AMPICILLIN/SULBACTAM >=32 RESISTANT Resistant     PIP/TAZO 8 SENSITIVE Sensitive     * >=100,000 COLONIES/mL ESCHERICHIA [facility]  Resp Panel by RT-PCR (Flu A&B, Covid) Nasopharyngeal Swab     Status: None   Collection Time: 12/02/20  9:34 PM   Specimen: Nasopharyngeal Swab; Nasopharyngeal(NP) swabs in vial transport medium  Result Value Ref Range Status   SARS Coronavirus 2 by RT PCR NEGATIVE NEGATIVE Final    Comment: (NOTE) SARS-CoV-2 target nucleic acids are NOT DETECTED.  The SARS-CoV-2 RNA is generally detectable in upper respiratory specimens during the acute phase of infection. The lowest concentration of SARS-CoV-2 viral copies this assay can detect is 138 copies/mL. A negative result does not preclude SARS-Cov-2 infection and should not be used as the sole basis for  treatment or other patient management decisions. A negative result may occur with  improper specimen collection/handling, submission of specimen other than nasopharyngeal swab, presence of viral mutation(s) within the areas targeted by this assay, and inadequate number of viral copies(<138 copies/mL). A negative result must be combined with clinical observations, patient history, and epidemiological information. The expected result is Negative.  Fact Sheet for Patients:  BloggerCourse.com  Fact Sheet for Healthcare Providers:  SeriousBroker.ithttps://www.fda.gov/media/152162/download  This test is no t yet approved or cleared by the [place] FDA and  has been authorized for detection and/or diagnosis of SARS-CoV-2 by FDA under an Emergency Use Authorization (EUA). This EUA will remain  in effect (meaning this test can be used) for the duration of the COVID-19 declaration under Section 564(b)(1) of the Act, 21 U.S.C.section 360bbb-3(b)(1), unless the authorization is terminated  or revoked sooner.       Influenza A by PCR NEGATIVE NEGATIVE Final   Influenza B by PCR NEGATIVE NEGATIVE Final    Comment: (NOTE) The Xpert Xpress SARS-CoV-2/FLU/RSV plus assay is intended as an aid in the diagnosis of influenza from Nasopharyngeal swab specimens and should not be used as a sole basis for treatment. Nasal washings and aspirates are unacceptable for Xpert Xpress SARS-CoV-2/FLU/RSV testing.  Fact Sheet for Patients: BloggerCourse.comhttps://www.fda.gov/media/152166/download  Fact Sheet for Healthcare Providers: SeriousBroker.ithttps://www.fda.gov/media/152162/download  This test is not yet approved or cleared by the [place] FDA and has been authorized for detection and/or diagnosis of SARS-CoV-2 by FDA under an Emergency Use Authorization (EUA). This EUA will remain in effect (meaning this test can be used) for the duration of the COVID-19 declaration under Section 564(b)(1) of the Act, 21  U.S.C. section 360bbb-3(b)(1), unless the authorization is terminated or revoked.  Performed at [facility], [address] [place], [place] [id number]   SARS CORONAVIRUS 2 (TAT 6-24 HRS) Nasopharyngeal Nasopharyngeal Swab     Status: None   Collection Time: 12/05/20  4:29 PM   Specimen: Nasopharyngeal Swab  Result Value Ref Range Status   SARS Coronavirus 2 NEGATIVE NEGATIVE Final    Comment: (NOTE) SARS-CoV-2 target nucleic acids are NOT DETECTED.  The SARS-CoV-2 RNA is generally detectable in upper and lower respiratory specimens during the acute phase of infection. Negative results do not preclude SARS-CoV-2 infection, do not rule out co-infections with other pathogens, and should not be used as the sole basis for treatment or other patient management decisions. Negative results must be combined with clinical observations, patient history, and epidemiological information. The expected result is Negative.  Fact Sheet for Patients: HairSlick.nohttps://www.fda.gov/media/138098/download  Fact Sheet for Healthcare Providers: quierodirigir.comhttps://www.fda.gov/media/138095/download  This test is not yet approved or cleared by the [place] FDA and  has been authorized for detection and/or diagnosis of SARS-CoV-2 by FDA under an Emergency Use Authorization (EUA). This EUA will remain  in effect (meaning this test can be used) for the duration of the COVID-19 declaration under Se ction 564(b)(1) of the Act, 21 U.S.C. section 360bbb-3(b)(1), unless the authorization is terminated or revoked sooner.  Performed at [facility], [address] [place], [place] [address]      Labs: BNP (last 3 results) No results for input(s): BNP in the last 8760 hours. Basic Metabolic Panel: Recent Labs  Lab 12/02/20 2120 12/03/20 0340 12/04/20 0520 12/05/20 0423 12/06/20 0425  NA 136 138 139 140 140  K 3.5 3.8 3.4* 3.9 3.2*  CL 103 105 106 109 108  CO2 25 26 23  21* 23  GLUCOSE  153* 153* 79 86 72  BUN 34* 29* 24* 19 17  CREATININE 2.80* 2.22* 1.62* 1.59* 1.60*  CALCIUM 8.9 8.8* 9.2 9.1 9.2  MG  --  2.1  --   --   --    Liver Function Tests: Recent Labs  Lab 12/02/20 2120 12/03/20 0340  AST 21 18  ALT 33 28  ALKPHOS 25* 25*  BILITOT 1.1 1.3*  PROT 6.2* 5.9*  ALBUMIN 3.8 3.5   No results for input(s): LIPASE, AMYLASE in the last 168 hours. No results for input(s): AMMONIA in the last 168 hours. CBC: Recent Labs  Lab 12/02/20 2120 12/03/20 0340 12/04/20 0520 12/05/20 0423 12/06/20 0425  WBC 15.5* 15.3* 16.8* 11.7* 7.9  NEUTROABS 13.3* 12.8*  --   --   --   HGB 10.6* 10.2* 10.5* 10.5* 9.8*  HCT 31.4* 30.5* 31.4* 31.8* 29.8*  MCV 90.0 90.2 90.0 91.6 91.7  PLT 147* 130* 144* 151 164   Cardiac Enzymes: No results for input(s): CKTOTAL, CKMB, CKMBINDEX, TROPONINI in the last 168 hours. BNP: Invalid input(s): POCBNP CBG: Recent Labs  Lab 12/06/20 0717 12/06/20 1157 12/06/20 1617 12/06/20 2040 12/07/20 0716  GLUCAP 86 128* 119* 152* 86   D-Dimer No results for input(s): DDIMER in the last 72 hours. Hgb A1c No results for input(s): HGBA1C in the last 72 hours.  Lipid Profile No results for input(s): CHOL, HDL, LDLCALC, TRIG, CHOLHDL, LDLDIRECT in the last 72 hours. Thyroid function studies No results for input(s): TSH, T4TOTAL, T3FREE, THYROIDAB in the last 72 hours.  Invalid input(s): FREET3 Anemia work up No results for input(s): VITAMINB12, FOLATE, FERRITIN, TIBC, IRON, RETICCTPCT in the last 72 hours. Urinalysis    Component Value Date/Time   COLORURINE YELLOW 12/02/2020 2118   APPEARANCEUR CLOUDY (A) 12/02/2020 2118   APPEARANCEUR Clear 06/14/2020 1126   LABSPEC 1.019 12/02/2020 2118   PHURINE 5.0 12/02/2020 2118   GLUCOSEU 50 (A) 12/02/2020 2118   HGBUR LARGE (A) 12/02/2020 2118   BILIRUBINUR NEGATIVE 12/02/2020 2118   BILIRUBINUR Negative 06/14/2020 1126   KETONESUR 5 (A) 12/02/2020 2118   PROTEINUR 100 (A) 12/02/2020  2118   NITRITE NEGATIVE 12/02/2020 2118   LEUKOCYTESUR LARGE (A) 12/02/2020 2118   Sepsis Labs Invalid input(s): PROCALCITONIN,  WBC,  LACTICIDVEN Microbiology Recent Results (from the past 240 hour(s))  Blood culture (routine single)     Status: None (Preliminary result)   Collection Time: 12/02/20  9:15 PM   Specimen: BLOOD  Result Value Ref Range Status   Specimen Description   Final    BLOOD RIGHT ANTECUBITAL Performed at [facility], [address], [place], [place] [address]    Special Requests   Final    BOTTLES DRAWN AEROBIC AND ANAEROBIC Blood Culture results may not be optimal due to an excessive volume of blood received in culture bottles Performed at [facility], [address], [place], [place] [address]    Culture   Final    NO GROWTH 4 DAYS Performed at [facility], [address], [place], [place] [address]    Report Status PENDING  Incomplete  Urine Culture     Status: Abnormal   Collection Time: 12/02/20  9:18 PM   Specimen: In/Out Cath Urine  Result Value Ref Range Status   Specimen Description   Final    IN/OUT CATH URINE Performed at [facility], [address], [place], [place] [address]    Special Requests   Final    NONE Performed at [facility], [address], [place], [place] [address]    Culture >=100,000 COLONIES/mL ESCHERICHIA [facility] (A)  Final   Report Status 12/05/2020 FINAL  Final   Organism ID, Bacteria ESCHERICHIA [facility] (A)  Final      Susceptibility   Escherichia [facility] - MIC*    AMPICILLIN >=32 RESISTANT Resistant     CEFAZOLIN  16 SENSITIVE Sensitive     CEFEPIME <=0.12 SENSITIVE Sensitive     CEFTRIAXONE <=0.25 SENSITIVE Sensitive     CIPROFLOXACIN <=0.25 SENSITIVE Sensitive     GENTAMICIN <=1 SENSITIVE Sensitive     IMIPENEM <=0.25 SENSITIVE Sensitive     NITROFURANTOIN 32 SENSITIVE Sensitive     TRIMETH/SULFA >=320 RESISTANT Resistant      AMPICILLIN/SULBACTAM >=32 RESISTANT Resistant     PIP/TAZO 8 SENSITIVE Sensitive     * >=100,000 COLONIES/mL ESCHERICHIA [facility]  Resp Panel by RT-PCR (Flu A&B, Covid) Nasopharyngeal Swab     Status: None   Collection Time: 12/02/20  9:34 PM   Specimen: Nasopharyngeal Swab; Nasopharyngeal(NP) swabs in vial transport medium  Result Value Ref Range Status   SARS Coronavirus 2 by RT PCR NEGATIVE NEGATIVE Final    Comment: (NOTE) SARS-CoV-2 target nucleic acids are NOT DETECTED.  The SARS-CoV-2 RNA is generally detectable in upper respiratory specimens during the acute phase of infection. The lowest concentration of SARS-CoV-2 viral copies this assay can detect is 138 copies/mL. A negative result does not preclude SARS-Cov-2 infection and should not be used as the sole basis for treatment or other patient management decisions. A negative result may occur with  improper specimen collection/handling, submission of specimen other than nasopharyngeal swab, presence of viral mutation(s) within the areas targeted by this assay, and inadequate number of viral copies(<138 copies/mL). A negative result must be combined with clinical observations, patient history, and epidemiological information. The expected result is Negative.  Fact Sheet for Patients:  BloggerCourse.com  Fact Sheet for Healthcare Providers:  SeriousBroker.it  This test is no t yet approved or cleared by the [place] FDA and  has been authorized for detection and/or diagnosis of SARS-CoV-2 by FDA under an Emergency Use Authorization (EUA). This EUA will remain  in effect (meaning this test can be used) for the duration of the COVID-19 declaration under Section 564(b)(1) of the Act, 21 U.S.C.section 360bbb-3(b)(1), unless the authorization is terminated  or revoked sooner.       Influenza A by PCR NEGATIVE NEGATIVE Final   Influenza B by PCR NEGATIVE NEGATIVE Final     Comment: (NOTE) The Xpert Xpress SARS-CoV-2/FLU/RSV plus assay is intended as an aid in the diagnosis of influenza from Nasopharyngeal swab specimens and should not be used as a sole basis for treatment. Nasal washings and aspirates are unacceptable for Xpert Xpress SARS-CoV-2/FLU/RSV testing.  Fact Sheet for Patients: BloggerCourse.com  Fact Sheet for Healthcare Providers: SeriousBroker.it  This test is not yet approved or cleared by the [place] FDA and has been authorized for detection and/or diagnosis of SARS-CoV-2 by FDA under an Emergency Use Authorization (EUA). This EUA will remain in effect (meaning this test can be used) for the duration of the COVID-19 declaration under Section 564(b)(1) of the Act, 21 U.S.C. section 360bbb-3(b)(1), unless the authorization is terminated or revoked.  Performed at [facility], [address] [address], [place], [place] [address]   SARS CORONAVIRUS 2 (TAT 6-24 HRS) Nasopharyngeal Nasopharyngeal Swab     Status: None   Collection Time: 12/05/20  4:29 PM   Specimen: Nasopharyngeal Swab  Result Value Ref Range Status   SARS Coronavirus 2 NEGATIVE NEGATIVE Final    Comment: (NOTE) SARS-CoV-2 target nucleic acids are NOT DETECTED.  The SARS-CoV-2 RNA is generally detectable in upper and lower respiratory specimens during the acute phase of infection. Negative results do not preclude SARS-CoV-2 infection, do not rule out co-infections with other pathogens,  and should not be used as the sole basis for treatment or other patient management decisions. Negative results must be combined with clinical observations, patient history, and epidemiological information. The expected result is Negative.  Fact Sheet for Patients: HairSlick.no  Fact Sheet for Healthcare Providers: quierodirigir.com  This test is not yet approved or  cleared by the [place] FDA and  has been authorized for detection and/or diagnosis of SARS-CoV-2 by FDA under an Emergency Use Authorization (EUA). This EUA will remain  in effect (meaning this test can be used) for the duration of the COVID-19 declaration under Se ction 564(b)(1) of the Act, 21 U.S.C. section 360bbb-3(b)(1), unless the authorization is terminated or revoked sooner.  Performed at [facility], [address] [place], [place] [address]      Time coordinating discharge:  35 minutes  SIGNED:   Dorcas Carrow, MD  [facility] 12/07/2020, 10:10 AM

## 2020-12-07 NOTE — Progress Notes (Signed)
Report given to [place] at [facility]. Pt will be transported to the facility by daughter [name], facility is aware. Prior to picking up Anthony Morse daughter state she may stop with her dad for lunch.

## 2020-12-07 NOTE — TOC Transition Note (Signed)
Transition of Care [facility]) - CM/SW Discharge Note   Patient Details  Name: Anthony Morse MRN: [id number] Date of Birth: 1950-12-26  Transition of Care [facility]) CM/SW Contact:  Lanier Clam, RN Phone Number: 12/07/2020, 9:50 AM   Clinical Narrative: [facility] rep Imane-request updated d/c summary to include med-Praluent/alirocumab-start-end date;also add family to proive own free style libre glucometer-spoke to dtr Sarah-she already has the glucometer,& she will transport on onw to [facility]. Await updated d/c summary-MD notified prior faxing to [facility]-fax#[phone]. Patient going to rm#[id number],nsg call report tel# [phone] after CM has sent updated d/c summary.     Final next level of care: Assisted Living Barriers to Discharge: No Barriers Identified   Patient Goals and CMS Choice Patient states their goals for this hospitalization and ongoing recovery are:: To go to ALF CMS Medicare.gov Compare Post Acute Care list provided to:: Patient Represenative (must comment) Choice offered to / list presented to : Adult Children  Discharge Placement                       Discharge Plan and Services     Post Acute Care Choice:  [name])                               Social Determinants of Health (SDOH) Interventions     Readmission Risk Interventions No flowsheet data found.

## 2020-12-08 LAB — CULTURE, BLOOD (SINGLE): Culture: NO GROWTH

## 2020-12-08 NOTE — Care Management (Signed)
After d/c note Received call from [facility] rep Aniwa about needing additional info on fl2-noted that yesterday Dr. Was not ale to add info on the fl2-and added note on the d/c summary in reference to the request of [facility]-to start and end once seen by cardiology;familiy to provide their own [name] free style glucometer.Imane provided w/rm# for patient to d/c. Today Imane is asking for additional changes needed on fl2-including insulin which was not mentioned yesterday. Attempted to call Imane back twice but no response I have left 2 voicemails.I have also spoke to my supv Sharol Roussel who is aware of case,also informed Imane that Dr. Jerral Ralph is here today, & I could get in touch with him to correct any concerns. Await call back.

## 2020-12-09 ENCOUNTER — Telehealth: Payer: Self-pay | Admitting: Internal Medicine

## 2020-12-09 NOTE — Telephone Encounter (Signed)
I would recomm adding Zetia to regimen  F/U lipids in 8 wks

## 2020-12-09 NOTE — Telephone Encounter (Signed)
There are no oral alternatives to Praluent.  Patient currently on rosuvastatin 40g.  Could add Zetia 10mg  for additional LDL lowering effect.

## 2020-12-09 NOTE — Telephone Encounter (Signed)
Pt c/o medication issue:  1. Name of Medication:  Alirocumab (PRALUENT) 75 MG/ML SOAJ  2. How are you currently taking this medication (dosage and times per day)?   3. Are you having a reaction (difficulty breathing--STAT)?   4. What is your medication issue?  Patient's daughter states the patient is now in an assisted living facility and they are unable to give injections at the facility. She states she would have to hire someone to give him his Praluent, She would like to know if the patient can take an alternative. Please advise.

## 2020-12-13 ENCOUNTER — Encounter: Payer: Self-pay | Admitting: Internal Medicine

## 2020-12-13 NOTE — Telephone Encounter (Signed)
Was called by Patient's daughter Anthony Morse whether I can call in to the ALF to change his haloperidol orders as they were not able to reach to his PCP. I called in to the ALF and talked to patient's nurse and she stated they have received orders from patient's PCP Dr [name] to stop Haloperidol. Advised that they should continue to take orders from his PCP. Updated patient's daughter on the phone.

## 2020-12-14 ENCOUNTER — Emergency Department (HOSPITAL_COMMUNITY)
Admission: EM | Admit: 2020-12-14 | Discharge: 2020-12-15 | Disposition: A | Discharge status: Home / Self Care | Payer: Medicare Other | Attending: Emergency Medicine | Admitting: Emergency Medicine

## 2020-12-14 ENCOUNTER — Other Ambulatory Visit: Payer: Self-pay

## 2020-12-14 ENCOUNTER — Encounter (HOSPITAL_COMMUNITY): Payer: Self-pay | Admitting: Oncology

## 2020-12-14 DIAGNOSIS — Z79899 Other long term (current) drug therapy: Secondary | ICD-10-CM | POA: Diagnosis not present

## 2020-12-14 DIAGNOSIS — F0390 Unspecified dementia without behavioral disturbance: Secondary | ICD-10-CM | POA: Diagnosis not present

## 2020-12-14 DIAGNOSIS — E1142 Type 2 diabetes mellitus with diabetic polyneuropathy: Secondary | ICD-10-CM | POA: Insufficient documentation

## 2020-12-14 DIAGNOSIS — Z7982 Long term (current) use of aspirin: Secondary | ICD-10-CM | POA: Insufficient documentation

## 2020-12-14 DIAGNOSIS — I129 Hypertensive chronic kidney disease with stage 1 through stage 4 chronic kidney disease, or unspecified chronic kidney disease: Secondary | ICD-10-CM | POA: Insufficient documentation

## 2020-12-14 DIAGNOSIS — I2510 Atherosclerotic heart disease of native coronary artery without angina pectoris: Secondary | ICD-10-CM | POA: Diagnosis not present

## 2020-12-14 DIAGNOSIS — Z87891 Personal history of nicotine dependence: Secondary | ICD-10-CM | POA: Insufficient documentation

## 2020-12-14 DIAGNOSIS — E1122 Type 2 diabetes mellitus with diabetic chronic kidney disease: Secondary | ICD-10-CM | POA: Diagnosis not present

## 2020-12-14 DIAGNOSIS — F919 Conduct disorder, unspecified: Secondary | ICD-10-CM | POA: Diagnosis present

## 2020-12-14 DIAGNOSIS — N1832 Chronic kidney disease, stage 3b: Secondary | ICD-10-CM | POA: Insufficient documentation

## 2020-12-14 DIAGNOSIS — Z794 Long term (current) use of insulin: Secondary | ICD-10-CM | POA: Diagnosis not present

## 2020-12-14 DIAGNOSIS — Z0000 Encounter for general adult medical examination without abnormal findings: Secondary | ICD-10-CM

## 2020-12-14 LAB — URINALYSIS, ROUTINE W REFLEX MICROSCOPIC
Bilirubin Urine: NEGATIVE
Glucose, UA: NEGATIVE mg/dL
Hgb urine dipstick: NEGATIVE
Ketones, ur: NEGATIVE mg/dL
Nitrite: NEGATIVE
Protein, ur: NEGATIVE mg/dL
Specific Gravity, Urine: 1.025 (ref 1.005–1.030)
pH: 5 (ref 5.0–8.0)

## 2020-12-14 NOTE — ED Provider Notes (Signed)
[facility]-EMERGENCY DEPT Provider Note   CSN: [id number] Arrival date & time: 12/14/20  1508     History Chief Complaint  Patient presents with   AMS    Anthony Morse is a 70 y.o. male.  HPI  70 year old male with past medical history of HTN, HLD, DM, CKD, dementia presents to the emergency department with concern for "acting out".  Patient just recently had an admission for urosepsis, has been transitioned to oral antibiotics of which she is still currently taking.  Patient has history of dementia, is reported to be at baseline in terms of orientation however the facility reports that he has been difficult to handle, acting out, refusing to get dressed and trying to elope.  Patient himself is oriented to himself.  He has no complaints at this time.  Is intermittently redirectable but otherwise a very poor historian.  Past Medical History:  Diagnosis Date   Chronic kidney disease, stage 3b (HCC) 12/03/2020   Coronary artery disease involving native heart without angina pectoris 10/22/2017   Formatting of this note might be different from the original. 2012 CABGx5 [facility]   Dementia [facility])    DM (diabetes mellitus) (HCC)    Edema leg    Gastroesophageal reflux disease without esophagitis 12/03/2020   High cholesterol    History of MI (myocardial infarction)    Hypertension    Kidney disease    Minimal change disease    per daughter's report, new Dx, on Prednisone   Myocardial infarction involving left anterior descending (LAD) coronary artery (HCC) 10/22/2017   Sleep apnea    Type 2 diabetes mellitus with stage 3b chronic kidney disease, with long-term current use of insulin (HCC) 12/03/2020    Patient Active Problem List   Diagnosis Date Noted   Sepsis secondary to UTI (HCC) 12/03/2020   Acute metabolic encephalopathy 12/03/2020   Chronic kidney disease, stage 3b (HCC) 12/03/2020   Type 2 diabetes mellitus with stage 3b chronic kidney disease, with long-term  current use of insulin (HCC) 12/03/2020   Mixed diabetic hyperlipidemia associated with type 2 diabetes mellitus (HCC) 12/03/2020   Diabetic polyneuropathy associated with type 2 diabetes mellitus (HCC) 12/03/2020   Gastroesophageal reflux disease without esophagitis 12/03/2020   Dementia (HCC) 10/25/2020   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 Authorizing Provider  Alirocumab (PRALUENT) 75 MG/ML SOAJ Inject 1 pen into the skin every 14 (fourteen) days. 10/25/20   Pricilla Riffle, MD  aspirin EC 81 MG tablet Take 81 mg by mouth daily. Swallow whole.    [provider]  calcium carbonate (TUMS - DOSED IN MG ELEMENTAL CALCIUM) 500 MG chewable tablet Chew 500 mg by mouth daily as needed for indigestion or heartburn.    [provider]  Continuous Blood Gluc Sensor (FREESTYLE LIBRE 14 DAY SENSOR) MISC Apply topically as directed. 09/27/20   [provider]  fenofibrate micronized (ANTARA) 130 MG capsule Take 130 mg by mouth daily before breakfast.    [provider]  furosemide (LASIX) 40 MG tablet Take 40 mg by mouth.    [provider]  gabapentin (NEURONTIN) 100 MG capsule Take 100 mg by mouth 2 (two) times daily.    [provider]  haloperidol (HALDOL) 2 MG tablet Take 1 tablet (2 mg total) by mouth at bedtime. 12/07/20 01/06/21  Dorcas Carrow, MD  haloperidol (HALDOL) 2 MG tablet Take 1 tablet (2 mg total) by mouth every 12 (twelve)  hours as needed for agitation. 12/07/20 01/06/21  Dorcas Carrow, MD  insulin aspart protamine- aspart (NOVOLOG MIX 70/30) (70-30) 100 UNIT/ML injection Inject 25-50 Units into the skin 2 (two) times daily with a meal.    [provider]  losartan (COZAAR) 25 MG tablet Take 25 mg by mouth daily. 09/16/20   [provider]  melatonin 3 MG TABS tablet Take 1 tablet (3 mg total) by mouth at bedtime. 12/07/20 01/06/21  Dorcas Carrow, MD  midodrine (PROAMATINE) 5 MG tablet Take 5 mg by mouth 2 (two) times daily.    [provider]  niacin 500 MG tablet Take 500 mg by mouth at bedtime.    [provider]  predniSONE (DELTASONE) 10 MG tablet Take 10 mg by mouth daily with breakfast.    [provider]  rosuvastatin (CRESTOR) 40 MG tablet Take 1 tablet (40 mg total) by mouth daily. 06/22/20   Pricilla Riffle, MD  tacrolimus (PROGRAF) 0.5 MG capsule Take 0.5 mg by mouth 2 (two) times daily. 11/30/20   [provider]    Allergies    Tramadol  Review of Systems   Review of Systems  Unable to perform ROS: Dementia   Physical Exam Updated Vital Signs BP (!) 169/105   Pulse 98   Temp 98.7 F (37.1 C) (Oral)   Resp 16   SpO2 99%   Physical Exam Vitals and nursing note reviewed.  Constitutional:      General: He is not in acute distress.    Appearance: Normal appearance. He is not ill-appearing.  HENT:     Head: Normocephalic.     Mouth/Throat:     Mouth: Mucous membranes are moist.  Cardiovascular:     Rate and Rhythm: Normal rate.  Pulmonary:     Effort: Pulmonary effort is normal. No respiratory distress.  Abdominal:     Palpations: Abdomen is soft.     Tenderness: There is no abdominal tenderness.  Musculoskeletal:        General: No swelling or deformity.  Skin:    General: Skin is warm.  Neurological:     Mental Status: He is alert. Mental status is at baseline.     Comments: Oriented to self which is reported to be baseline,  ambulatory but appears unsteady, reportedly use assistance at the facility    ED Results / Procedures / Treatments   Labs (all labs ordered are listed, but only abnormal results are displayed) Labs Reviewed  URINALYSIS, ROUTINE W REFLEX MICROSCOPIC    EKG None  Radiology No results found.  Procedures Procedures   Medications Ordered in ED Medications - No data to display  ED Course  I have reviewed the triage vital signs and the nursing notes.  Pertinent labs & imaging results that were available during my care of the patient were reviewed by me and considered in my medical decision making (see chart  for details).    MDM Rules/Calculators/A&P                           71 year old male presents the emergency department after "acting out".  Here his vitals are stable, he is demented but cooperative which is reported to be his baseline.  Since he had a recent admission for urosepsis and there is been concern for change in behavior a repeat UA was done which seems appropriate for continued outpatient oral antibiotic treatment of UTI.  Do not believe he is failing outpatient therapy for UTI, no fever.  No other complaints or acute findings on evaluation.  Plan for discharge back to facility.  Patient at this time appears safe and stable for discharge and will be treated as an outpatient.  Discharge plan and strict return to ED precautions discussed, patient verbalizes understanding and agreement.  Final Clinical Impression(s) / ED Diagnoses Final diagnoses:  None    Rx / DC Orders ED Discharge Orders     None        Rozelle Logan, DO 12/14/20 2051

## 2020-12-14 NOTE — Telephone Encounter (Signed)
Returned call to patient's daughter.  No answer, mailbox full.  Unable to leave a message.

## 2020-12-14 NOTE — ED Notes (Signed)
Patient left his room again. I told him he could not walk into the hall way due to Covid but that he was welcome to walk in his room. He said he was cold so I gave him a warm blanket.

## 2020-12-14 NOTE — ED Notes (Signed)
Patient was assisted with trying to use the urinal. He was not able to urinate. He then said "this thing was supposed to have an electrical signal however it went out after 30 minutes. Can you let the doctor in [place] know?".
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
## 2020-12-14 NOTE — ED Notes (Addendum)
Patient walked down the hall again. I told him he had to stay in his room. He asked me to help him get into the bed so I helped him get comfortable and covered him with warm blankets. He closed his eyes as I was leaving.

## 2020-12-14 NOTE — Discharge Instructions (Addendum)
You have been seen and discharged from the emergency department.  Follow-up with your primary provider for reevaluation and further care. Take home medications as prescribed. If you have any worsening symptoms or further concerns for your health please return to an emergency department for further evaluation. 

## 2020-12-14 NOTE — ED Triage Notes (Signed)
Pt bib [facility] from [facility].  Per EMS pt recently hospitalized for UTI and started on haldol d/t behavior issues associated w/ dementia.  The facility stopped haldol yesterday.  Today pt has been hard to redirect, refused to put clothing on and was walking through the facility nude.  Pt also eloped x 2 from the facility as well as entered other residents rooms.    EMS VS 122/84 90 97% 207 CBG

## 2020-12-14 NOTE — ED Notes (Signed)
Spoke w/ pt's daughter [name] who reports that haldol did appear to cause an increase in her dads confusion.  Explained to [name] that pt is attempting to leave ED and needing copious amounts of redirection at this time.  Inquired if there was a family member that could come be with pt during ED visit.  [name] stated she would attempt to reach her brother and see if he could possibly come sit w/ pt.  Will continue monitoring and redirecting pt.

## 2020-12-14 NOTE — ED Notes (Signed)
Patient woke up and was getting out of bed. He said it is morning time I have to get up. I explained he can walk in his room but he can not go in the hallway.

## 2020-12-14 NOTE — ED Notes (Signed)
Pt continues to get OOB stating he is ready to leave.  Pt is no longer pleasantly confused.

## 2020-12-14 NOTE — ED Notes (Signed)
Patient is currently pacing back and forth in his room.
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
## 2020-12-16 ENCOUNTER — Telehealth: Payer: Self-pay | Admitting: Pharmacist

## 2020-12-16 DIAGNOSIS — I2510 Atherosclerotic heart disease of native coronary artery without angina pectoris: Secondary | ICD-10-CM

## 2020-12-16 MED ORDER — EZETIMIBE 10 MG PO TABS: 10.0000 mg | ORAL_TABLET | Freq: Every day | ORAL | 3 refills | Status: AC

## 2020-12-16 NOTE — Telephone Encounter (Signed)
Faxed written Rx to [facility] d/c Praluent and starting Zetia 10mg  1 tablet once daily

## 2021-01-16 ENCOUNTER — Other Ambulatory Visit: Payer: Medicare Other

## 2021-01-19 DEATH — deceased

## 2021-02-15 ENCOUNTER — Encounter: Payer: Medicare Other | Admitting: Counselor

## 2021-02-22 ENCOUNTER — Encounter: Payer: Medicare Other | Admitting: Counselor

## 2022-04-06 IMAGING — US US RENAL
1 series · 14 of 25 positions shown · non-contrast
Comparison: None.

CLINICAL DATA: CKD

EXAM:
RENAL / URINARY TRACT ULTRASOUND COMPLETE

[Series 1: us renal · 0.22mm/px · 14 of 37 slices shown]
[im 1/37]
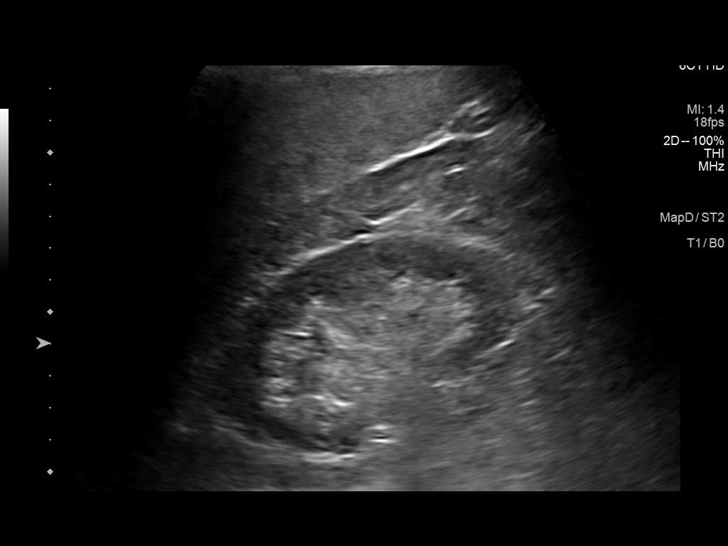
[im 4/37]
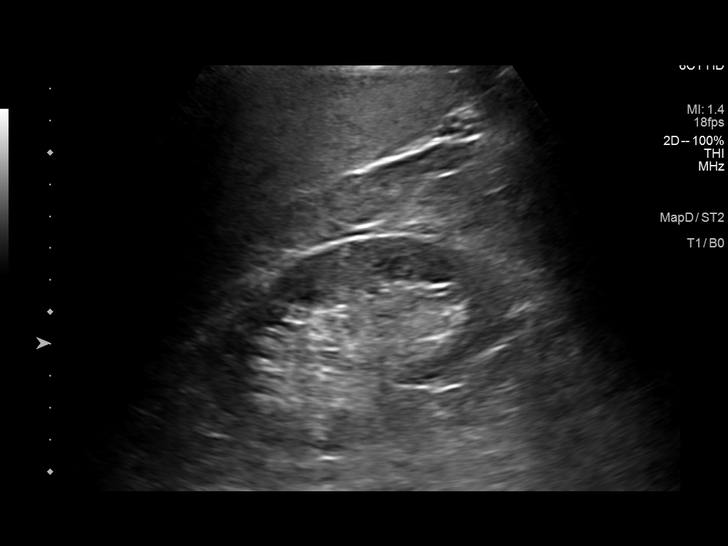
[im 7/37]
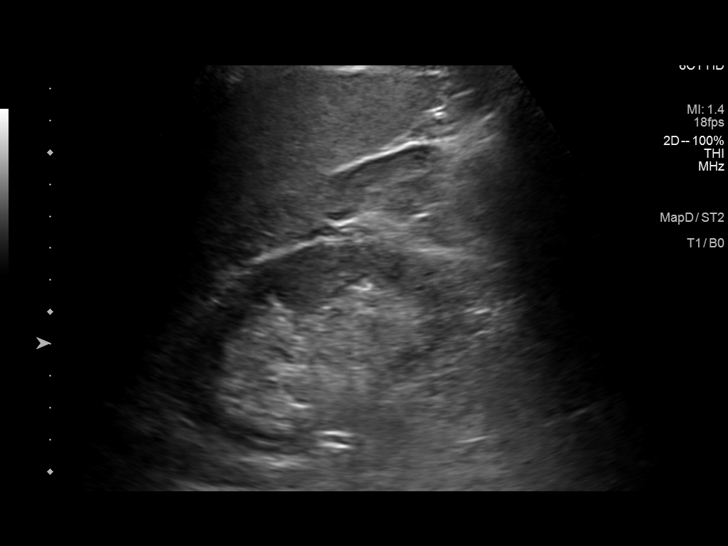
[im 10/37]
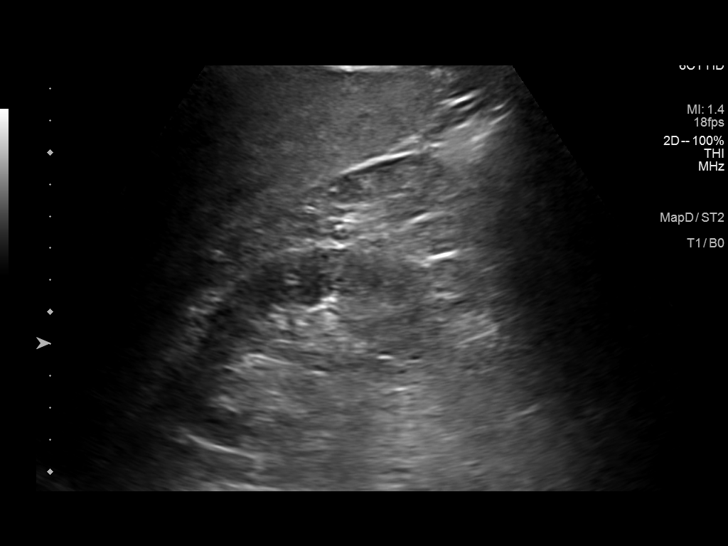
[im 13/37]
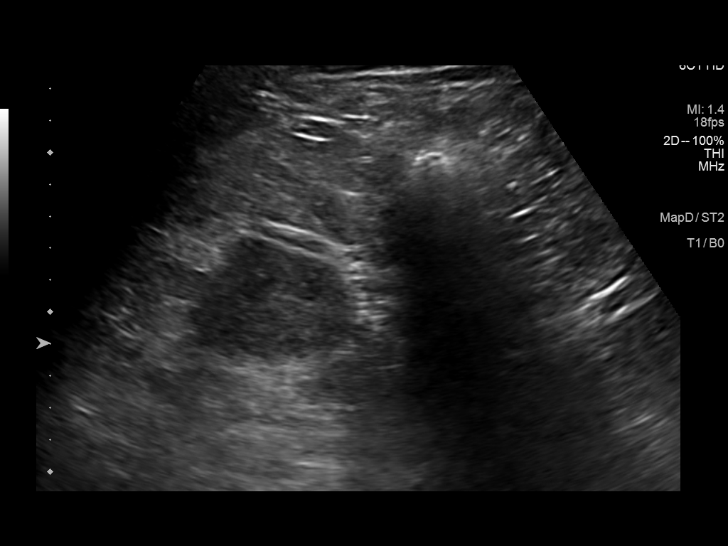
[im 14/37]
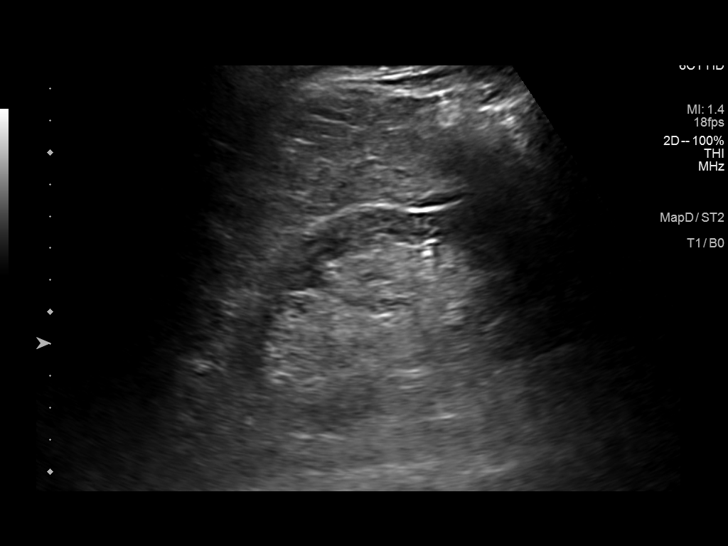
[im 17/37]
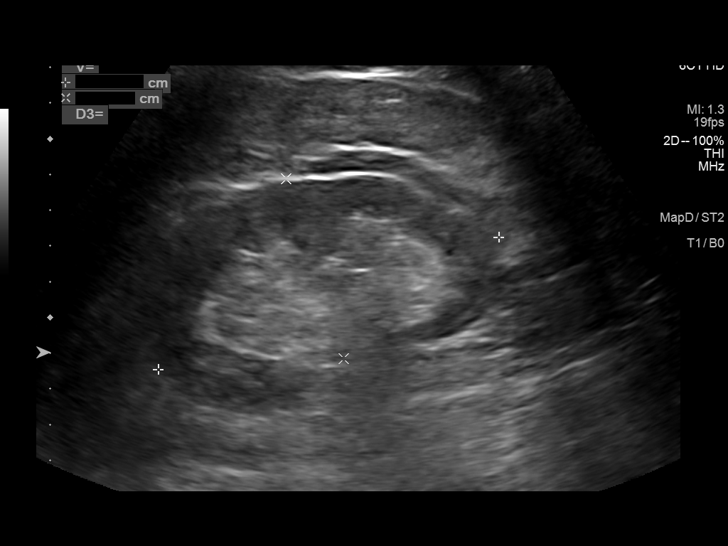
[im 20/37]
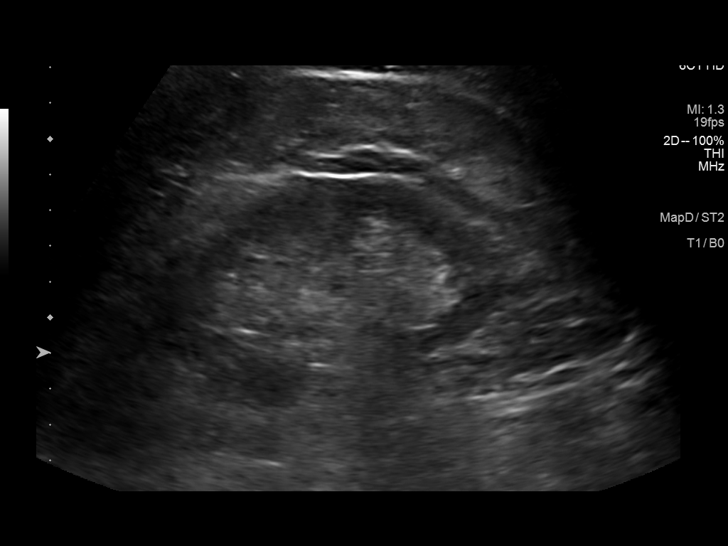
[im 23/37]
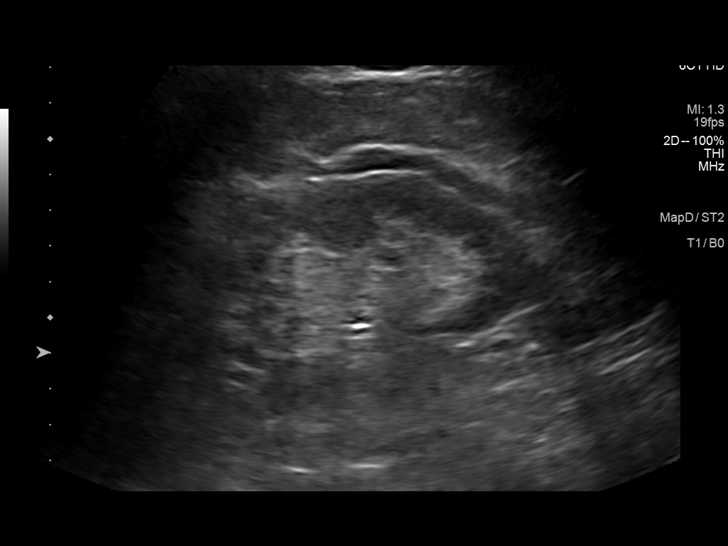
[im 25/37]
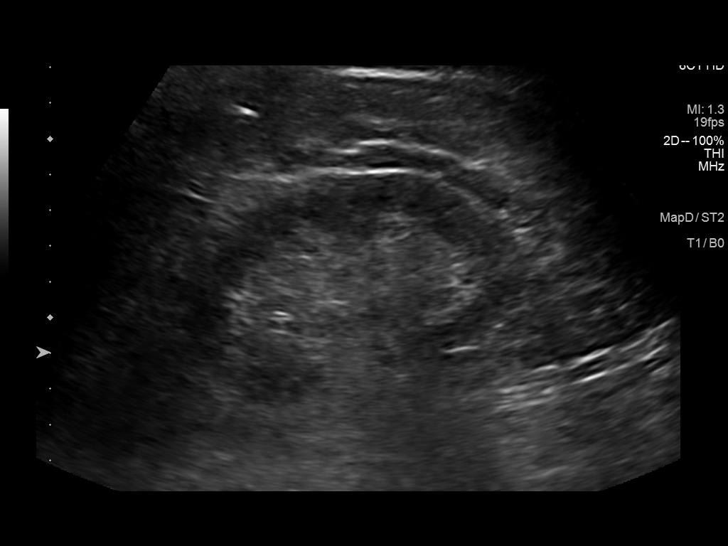
[im 28/37]
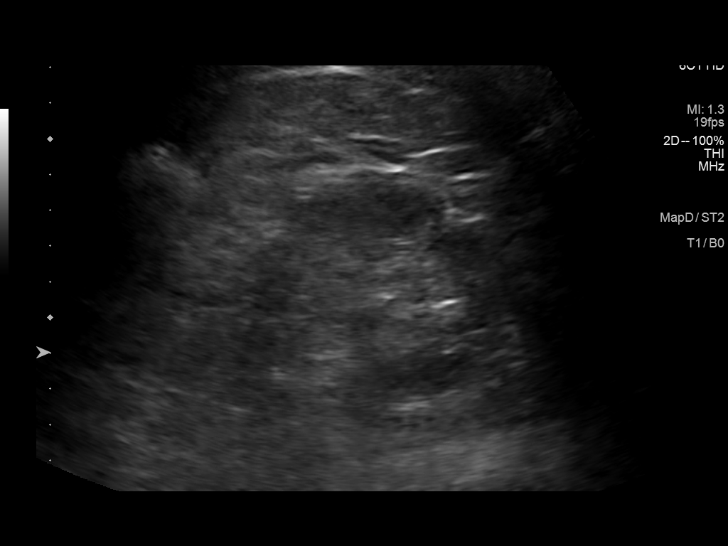
[im 31/37]
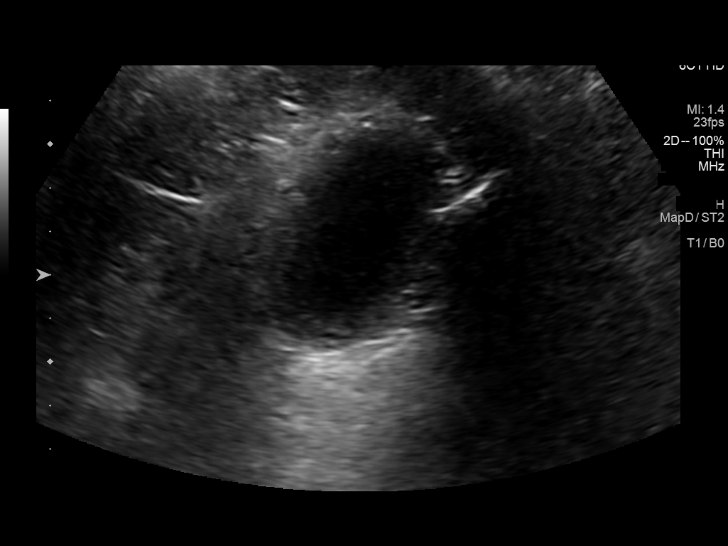
[im 34/37]
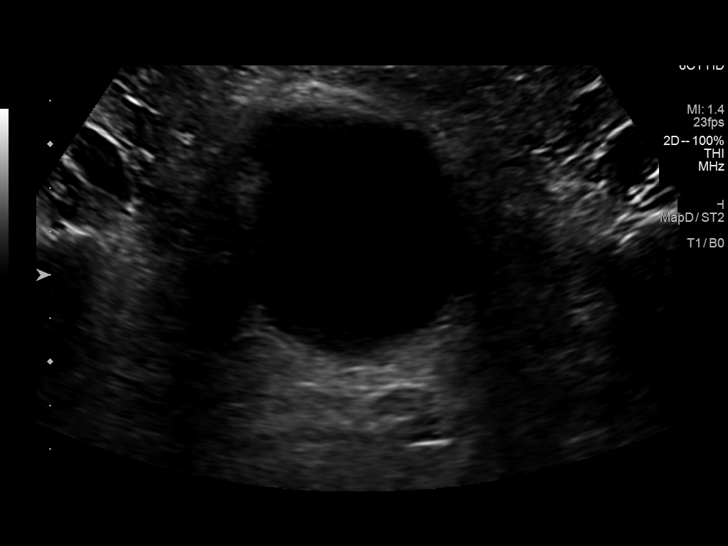
[im 37/37]
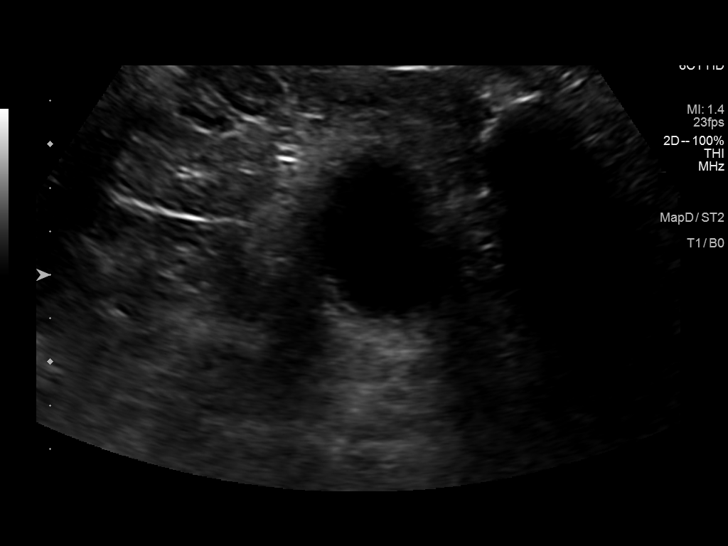

[14 of 25 positions shown; findings below may reference images not displayed]

FINDINGS: Right Kidney:

Renal measurements: 10.4 x 5.5 x 5.4 cm = volume: 162 mL.
Echogenicity appears mildly increased. No mass or hydronephrosis
visualized.

Left Kidney:

Renal measurements: 10.2 x 5.3 x 4.7 cm = volume: 132 mL.
Echogenicity appears mildly increased. No mass or hydronephrosis
visualized.

Bladder:

Appears normal for degree of bladder distention.

Other:

None.
IMPRESSION: No acute finding. Mildly increased renal parenchymal echogenicity as
can be seen in medical renal disease.

## 2022-07-27 IMAGING — MR MR ABDOMEN W/O CM
4 of 7 series · 22 of 48 positions shown · non-contrast
Comparison: CT scan 10/03/2020

CLINICAL DATA: Evaluate adrenal lesions seen on recent CT scan.

EXAM:
MRI ABDOMEN WITHOUT CONTRAST
TECHNIQUE: Multiplanar multisequence MR imaging was performed without the
administration of intravenous contrast.

[Series 3: cor haste · coronal · 5.0mm · 0.68mm/px · 4 of 27 slices shown]
[im 1/27]
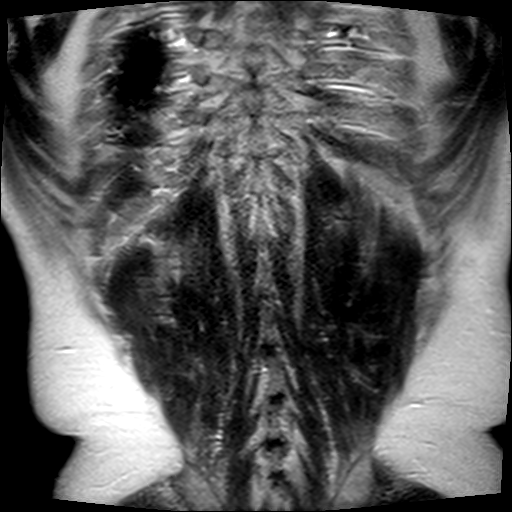
[im 9/27]
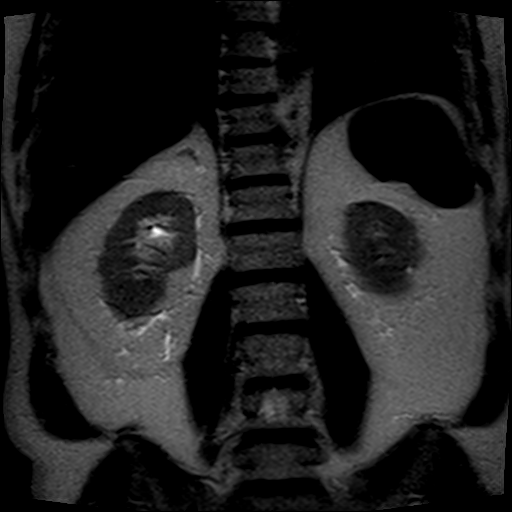
[im 18/27]
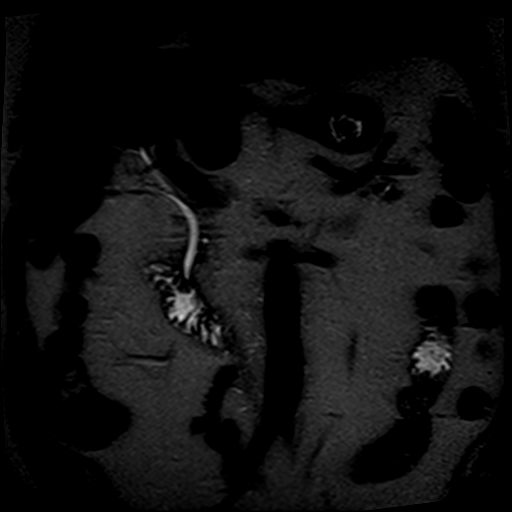
[im 27/27]
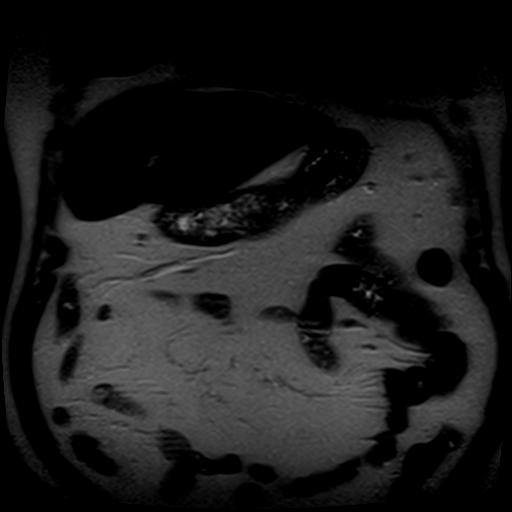

[Series 4: axial haste · axial · 5.0mm · 0.68mm/px · z∈[-68,+108]mm · 5 of 33 slices shown]
[im 1/33]
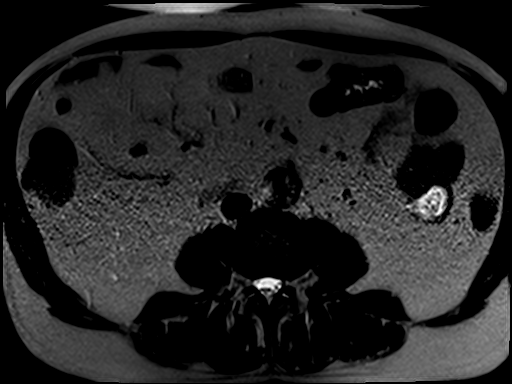
[im 9/33]
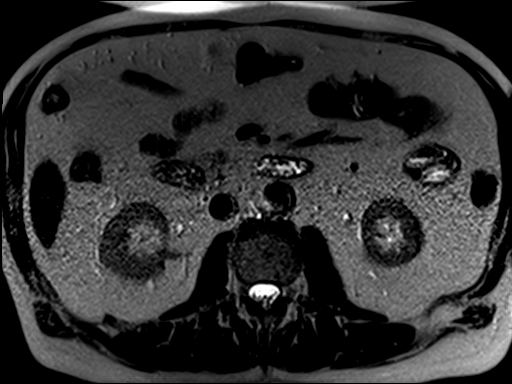
[im 17/33]
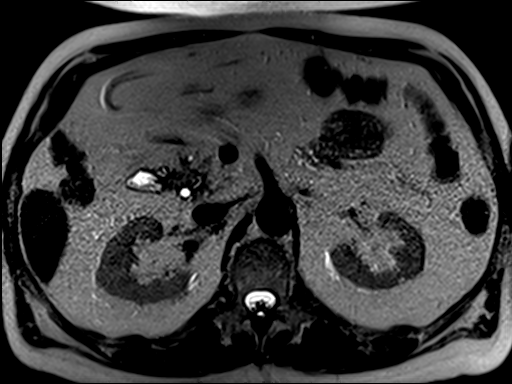
[im 25/33]
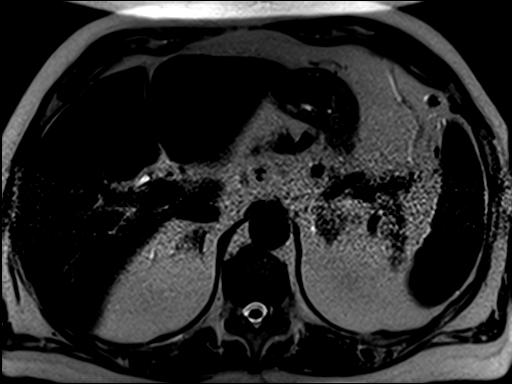
[im 33/33]
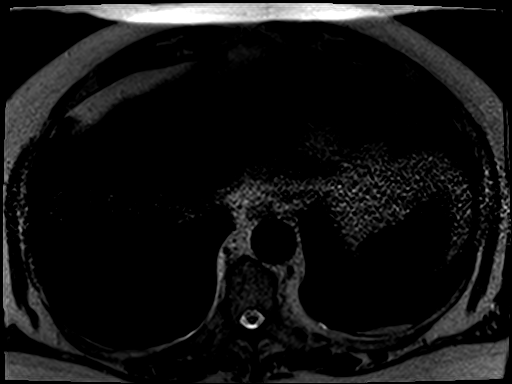

[Series 5: T1 · axial · 5.5mm · 0.68mm/px · z∈[-94,+99]mm · 9 of 66 slices shown (1 of 2)]
[im 1/66]
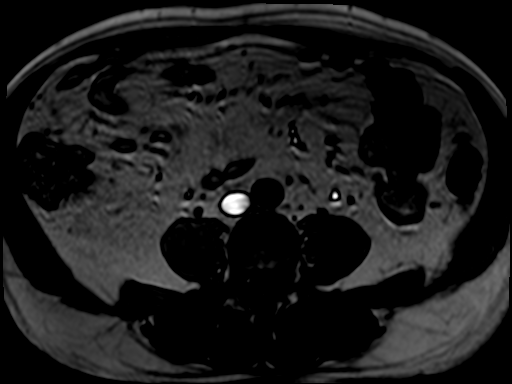
[im 9/66]
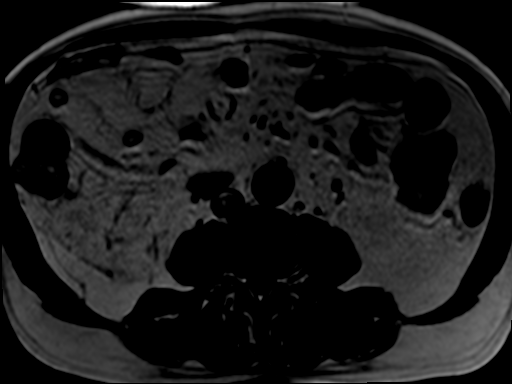
[im 17/66]
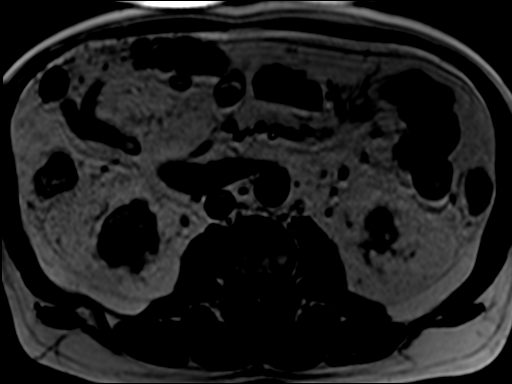
[im 25/66]
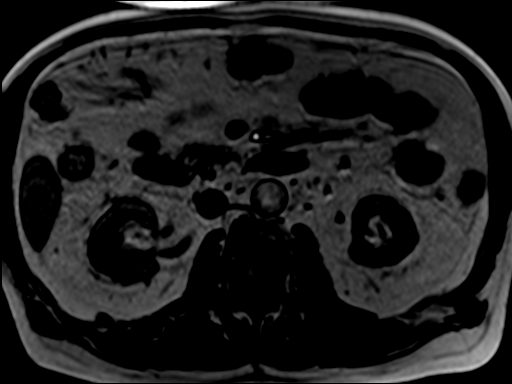
[im 33/66]
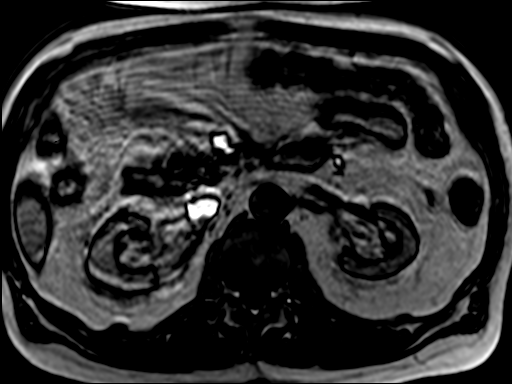
[im 41/66]
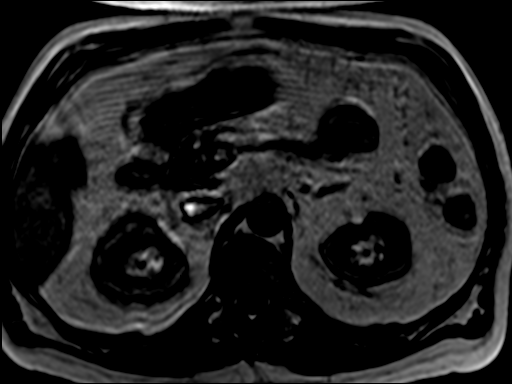
[im 49/66]
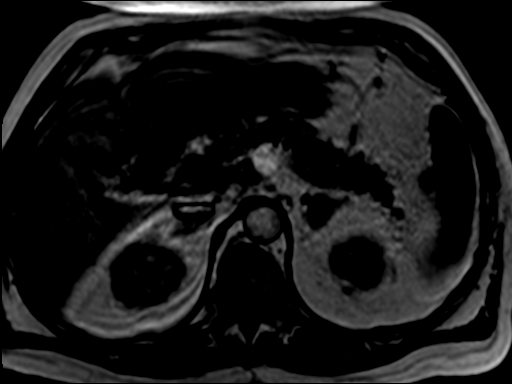
[im 57/66]
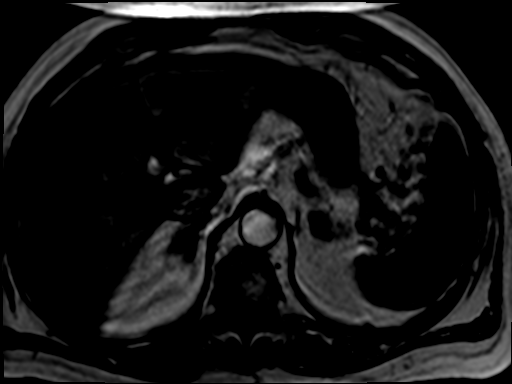
[im 66/66]
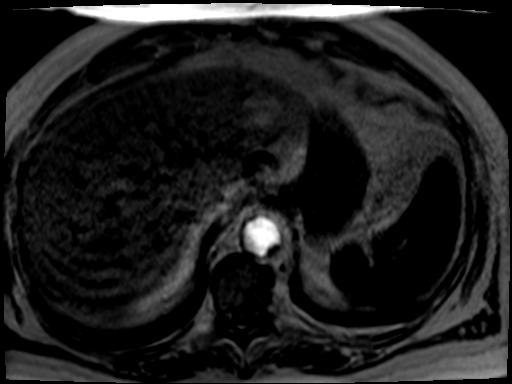

[Series 6: T1 · coronal · 4.0mm · 0.78mm/px · 4 of 44 slices shown (2 of 2)]
[im 1/44]
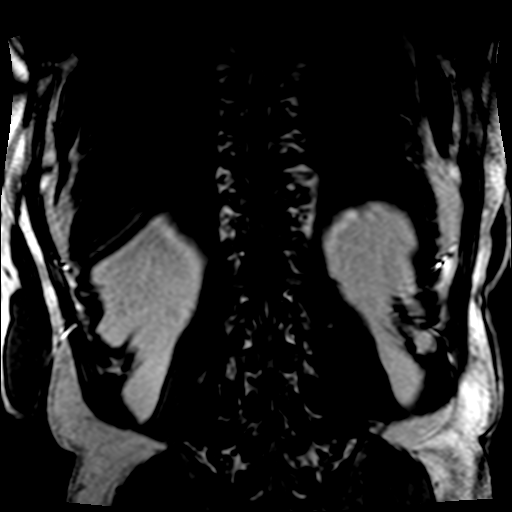
[im 9/44]
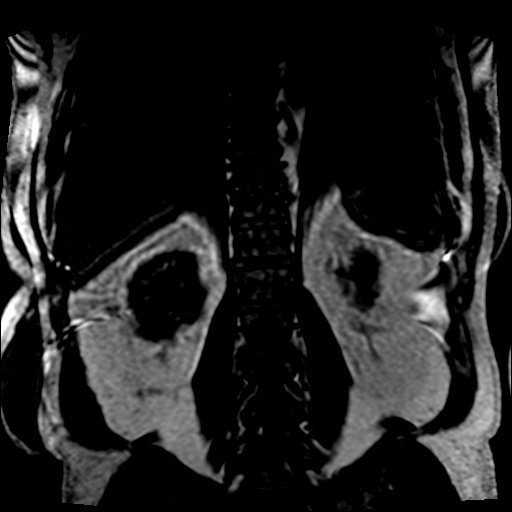
[im 26/44]
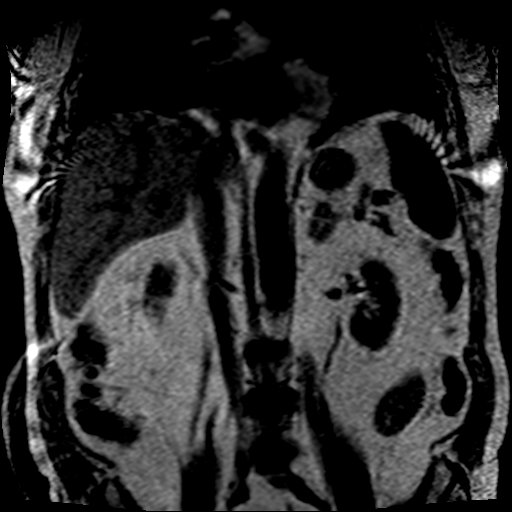
[im 44/44]
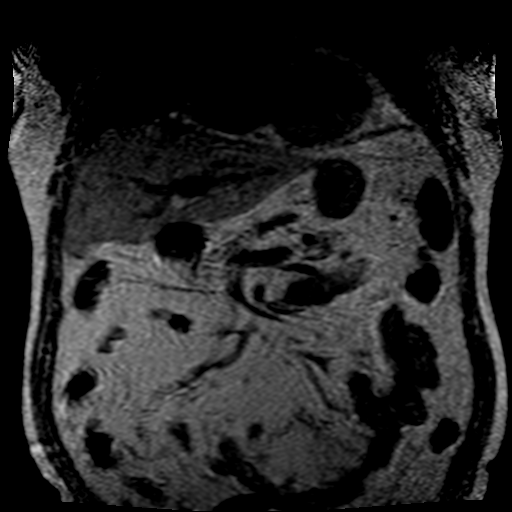

[22 of 48 positions shown; findings below may reference images not displayed]

FINDINGS: Lower chest: The lung bases are clear of an acute process. No
pulmonary lesions. No pleural or pericardial effusion.

Hepatobiliary: No hepatic lesions are identified. No intrahepatic
biliary dilatation. The gallbladder is surgically absent. Normal
caliber and course of the common bile duct.

Pancreas:  No mass, inflammation or ductal dilatation.

Spleen:  Normal size.  No focal lesions.

Adrenals/Urinary Tract: As demonstrated on the CT scan there are
bilateral adrenal gland nodules which are largely fatty based on the
Hounsfield units. On the MRI the show loss of signal intensity on
the out of phase gradient echo T1 weighted images consistent with
adenomas. No worrisome adrenal gland lesions.

The kidneys are unremarkable.  No renal lesions or hydronephrosis.

Stomach/Bowel: The stomach, duodenum, visualized small bowel and
visualized colon are grossly normal.

Vascular/Lymphatic: Age advanced atherosclerotic disease involving
the aorta no aortic aneurysm. No mesenteric or retroperitoneal mass
or adenopathy.

Other:  No ascites or abdominal wall hernia.

Musculoskeletal: No significant bony findings.
IMPRESSION: 1. Bilateral adrenal gland adenomas.
2. No acute abdominal findings, mass lesions or adenopathy.
3. Status post cholecystectomy. No biliary dilatation.
